# Patient Record
Sex: Female | Born: 1990 | Race: White | Hispanic: No | Marital: Single | Smoking: Never smoker
Health system: Southern US, Community
[De-identification: ages and names within clinical notes are randomized; demographics above are authoritative.]

## PROBLEM LIST (undated history)

## (undated) DIAGNOSIS — K219 Gastro-esophageal reflux disease without esophagitis: Secondary | ICD-10-CM

## (undated) DIAGNOSIS — J45909 Unspecified asthma, uncomplicated: Secondary | ICD-10-CM

## (undated) DIAGNOSIS — T7840XA Allergy, unspecified, initial encounter: Secondary | ICD-10-CM

## (undated) DIAGNOSIS — F419 Anxiety disorder, unspecified: Secondary | ICD-10-CM

## (undated) HISTORY — DX: Allergy, unspecified, initial encounter: T78.40XA

## (undated) HISTORY — DX: Unspecified asthma, uncomplicated: J45.909

## (undated) HISTORY — DX: Anxiety disorder, unspecified: F41.9

## (undated) HISTORY — DX: Gastro-esophageal reflux disease without esophagitis: K21.9

## (undated) HISTORY — PX: NO PAST SURGERIES: SHX2092

---

## 2011-05-25 ENCOUNTER — Emergency Department: Payer: Self-pay | Admitting: Unknown Physician Specialty

## 2011-10-26 ENCOUNTER — Emergency Department: Payer: Self-pay | Admitting: Emergency Medicine

## 2011-10-26 LAB — MONONUCLEOSIS SCREEN: Mono Test: NEGATIVE

## 2011-10-28 LAB — BETA STREP CULTURE(ARMC)

## 2012-07-27 IMAGING — CT CT HEAD WITHOUT CONTRAST
2 series · 16 of 30 positions shown, 20 images · non-contrast
Comparison: none

REASON FOR EXAM: head ache
COMMENTS:

PROCEDURE:     CT  - CT HEAD WITHOUT CONTRAST  - May 25, 2011  [DATE]
RESULT:     Technique: Helical 5mm sections were obtained from the skull
base to the vertex without administration of intravenous contrast.

[Series 2: without · axial · non-contrast · 0.44mm/px · z∈[-145,-25]mm · 13 of 30 slices shown, 17 images]
[im 3/30  brain]
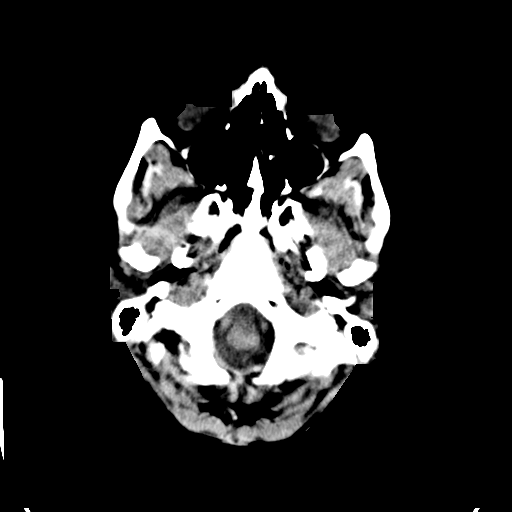
[im 3/30  bone]
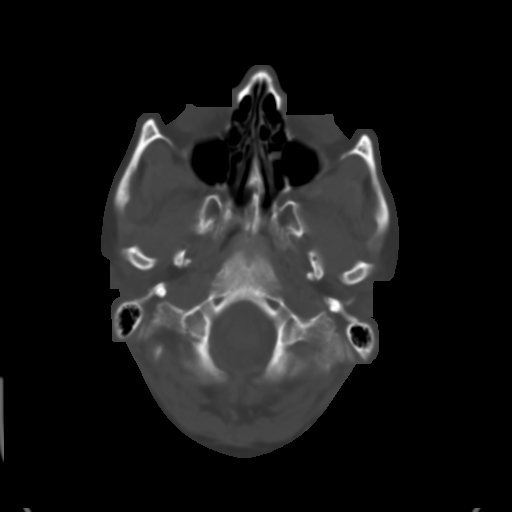
[im 5/30  brain]
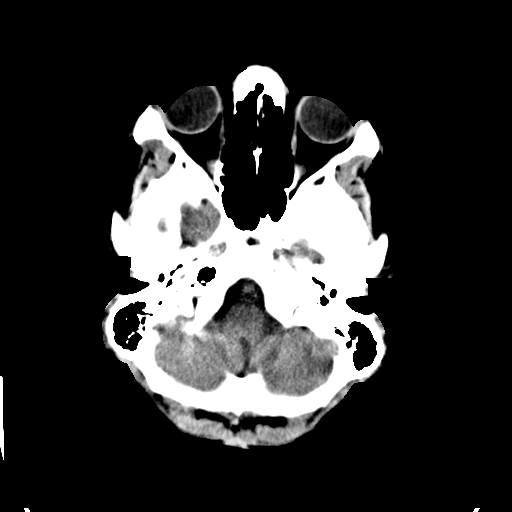
[im 7/30  brain]
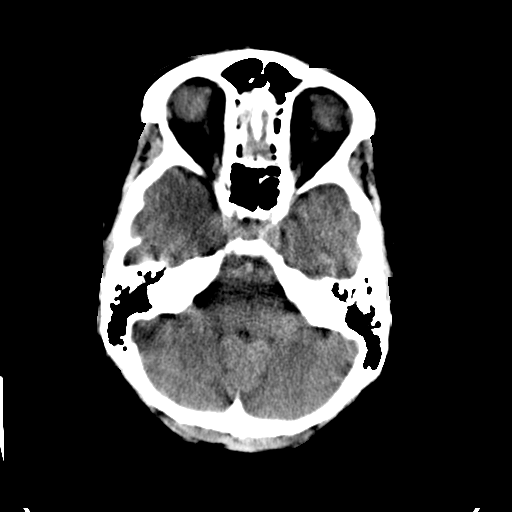
[im 9/30  brain]
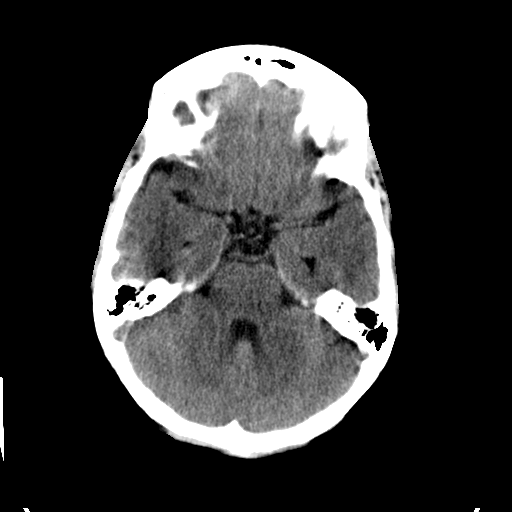
[im 11/30  brain]
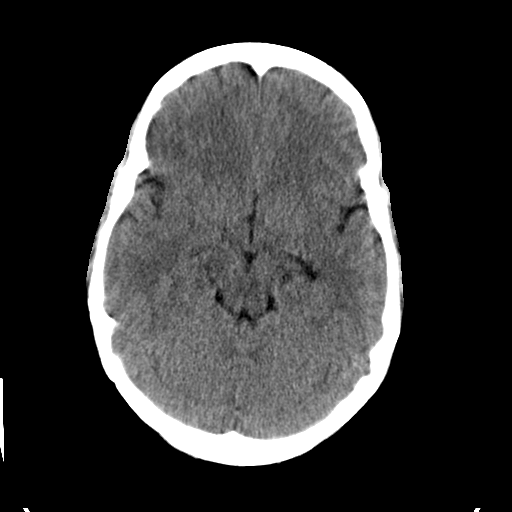
[im 11/30  bone]
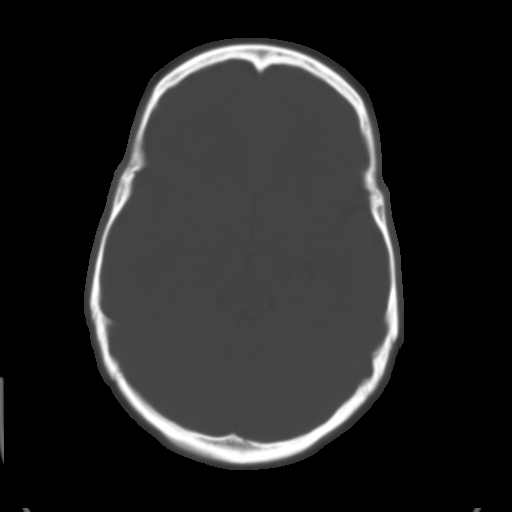
[im 13/30  brain]
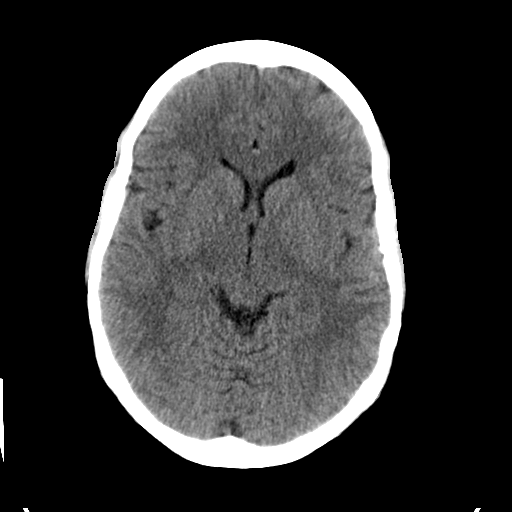
[im 15/30  brain]
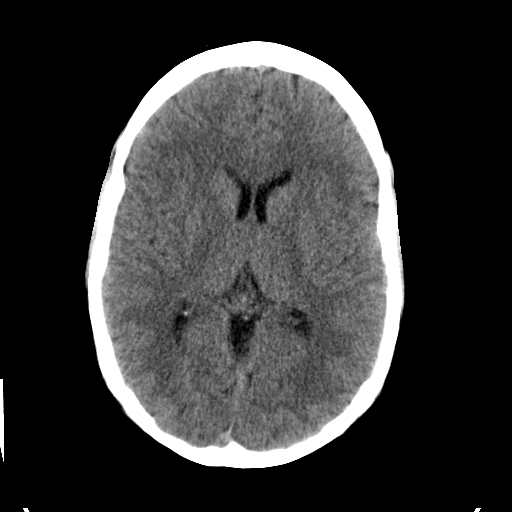
[im 17/30  brain]
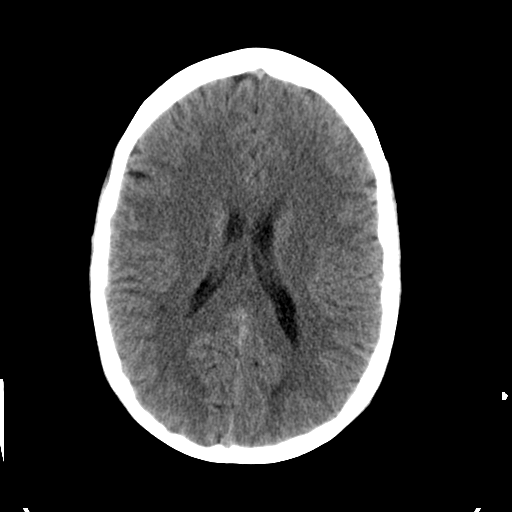
[im 19/30  brain]
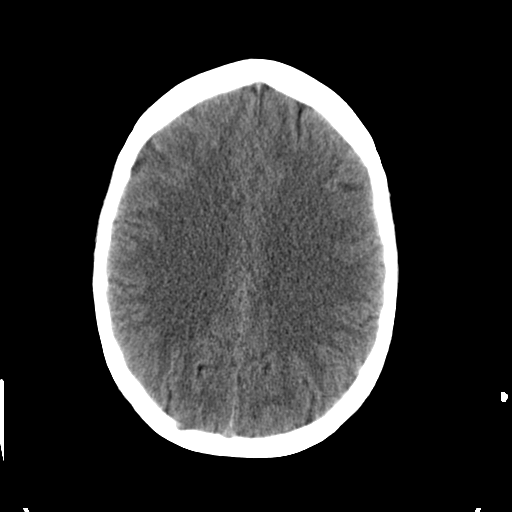
[im 19/30  bone]
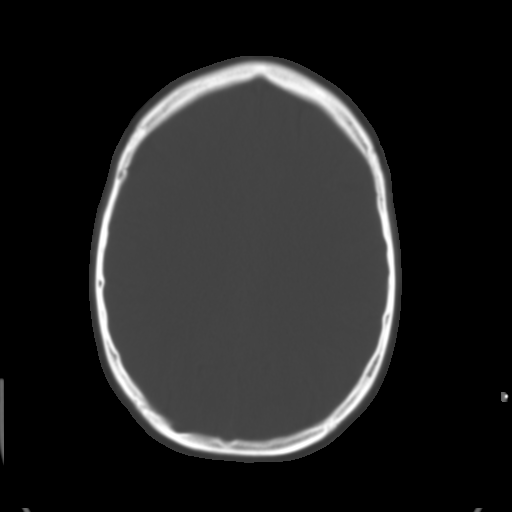
[im 21/30  brain]
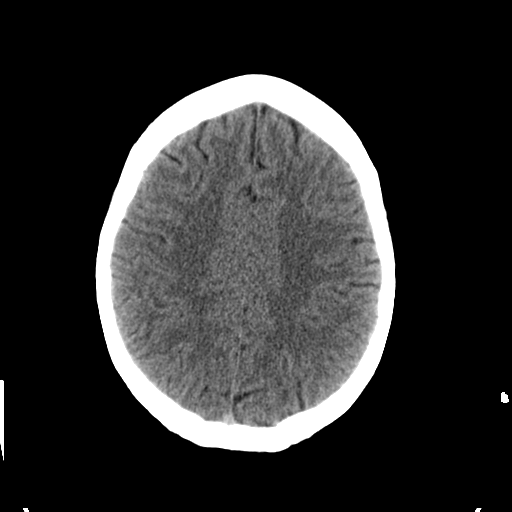
[im 23/30  brain]
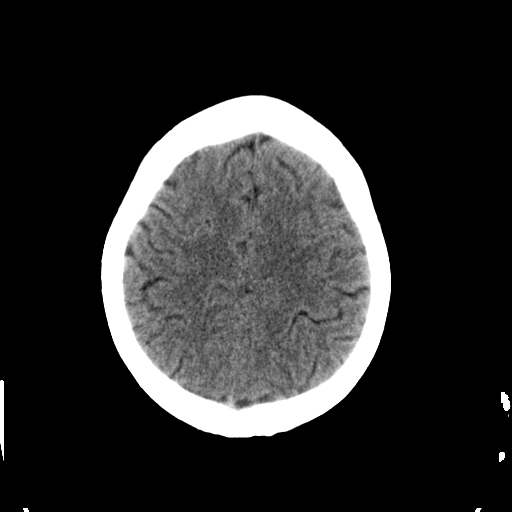
[im 25/30  brain]
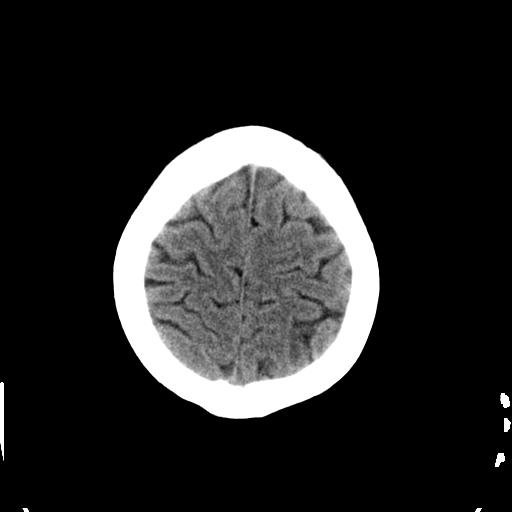
[im 27/30  brain]
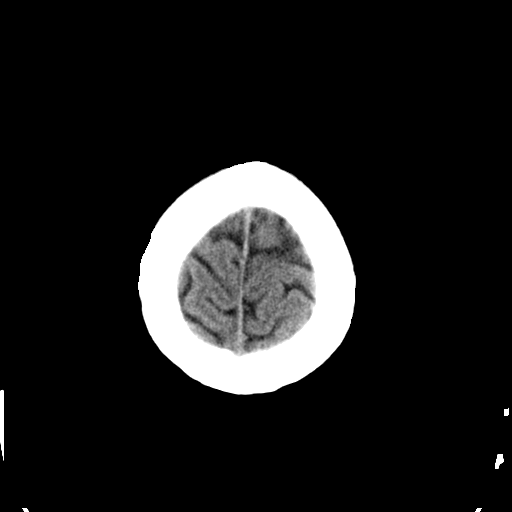
[im 27/30  bone]
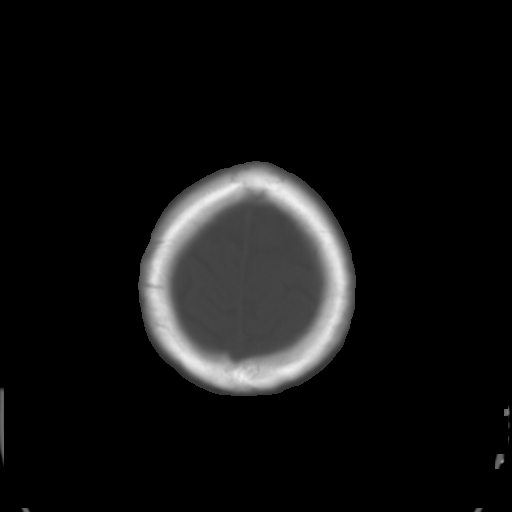

[Series 3: bone · axial · 0.44mm/px · z∈[-145,-105]mm · 3 of 30 slices shown]
[im 3/30  bone]
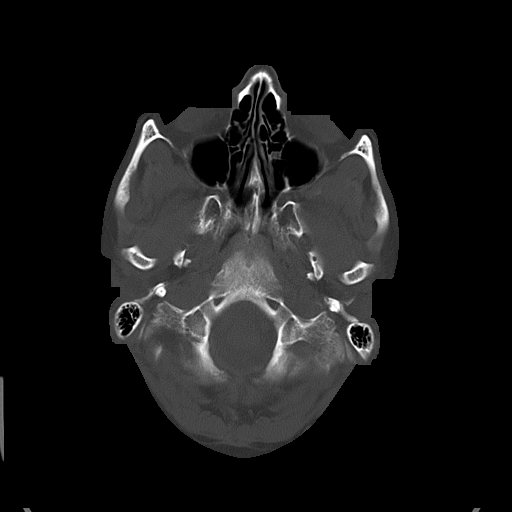
[im 7/30  bone]
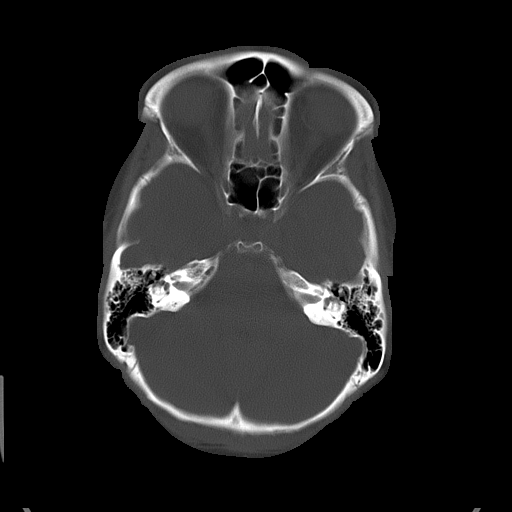
[im 11/30  bone]
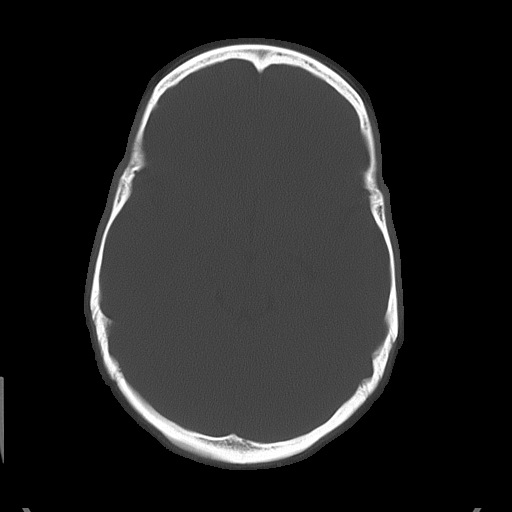

[16 of 30 positions shown; findings below may reference images not displayed]

FINDINGS: There is not evidence of intra-axial fluid collections. There is
no evidence of acute hemorrhage or secondary signs reflecting mass effect or
subacute or chronic focal territorial infarction. The osseous structures
demonstrate no evidence of a depressed skull fracture. If there is
persistent concern clinical follow-up with MRI is recommended.
IMPRESSION: 1. No evidence of acute intracranial abnormalitites.

## 2014-08-12 ENCOUNTER — Emergency Department: Payer: Self-pay | Admitting: Emergency Medicine

## 2016-04-01 ENCOUNTER — Encounter: Payer: Self-pay | Admitting: *Deleted

## 2016-04-03 ENCOUNTER — Encounter: Payer: Self-pay | Admitting: *Deleted

## 2016-04-09 ENCOUNTER — Ambulatory Visit (INDEPENDENT_AMBULATORY_CARE_PROVIDER_SITE_OTHER): Payer: 59 | Admitting: General Surgery

## 2016-04-09 ENCOUNTER — Encounter: Payer: Self-pay | Admitting: General Surgery

## 2016-04-09 ENCOUNTER — Ambulatory Visit: Payer: Self-pay | Admitting: General Surgery

## 2016-04-09 VITALS — BP 120/74 | HR 76 | Resp 12 | Ht 69.5 in | Wt 230.0 lb

## 2016-04-09 DIAGNOSIS — L729 Follicular cyst of the skin and subcutaneous tissue, unspecified: Secondary | ICD-10-CM | POA: Insufficient documentation

## 2016-04-09 DIAGNOSIS — L732 Hidradenitis suppurativa: Secondary | ICD-10-CM | POA: Insufficient documentation

## 2016-04-09 MED ORDER — DOXYCYCLINE HYCLATE 100 MG PO CAPS: 100.0000 mg | ORAL_CAPSULE | Freq: Two times a day (BID) | ORAL | 0 refills | Status: DC

## 2016-04-09 NOTE — Progress Notes (Signed)
Patient ID: Sheila Gilbert, female   DOB: 10-20-1990, 25 y.o.   MRN: [id number]  Chief Complaint  Patient presents with  . Other    left axillary lump    HPI Sheila Gilbert is a 25 y.o. female.  Here today for a left axillary knot. She first noticed this area mid May. States it is larger than a marble. Denies pain to the area. No drainage.  She states it may change colors with sport bras. She works at [employer] and [employer].  HPI  Past Medical History:  Diagnosis Date  . Anxiety     No past surgical history on file.  Family History  Problem Relation Age of Onset  . Breast cancer Maternal Grandmother 50    ovarian  . Breast cancer Maternal Aunt 30  . Ovarian cancer Maternal Aunt     Social History Social History  Substance Use Topics  . Smoking status: Never Smoker  . Smokeless tobacco: Never Used  . Alcohol use Yes    Allergies  Allergen Reactions  . Latex Swelling    Current Outpatient Prescriptions  Medication Sig Dispense Refill  . buPROPion (WELLBUTRIN SR) 150 MG 12 hr tablet Take 150 mg by mouth daily.    Marland Kitchen doxycycline (VIBRAMYCIN) 100 MG capsule Take 1 capsule (100 mg total) by mouth 2 (two) times daily. 28 capsule 0   No current facility-administered medications for this visit.     Review of Systems Review of Systems  Constitutional: Negative.   Respiratory: Negative.   Cardiovascular: Negative.     Blood pressure 120/74, pulse 76, resp. rate 12, height 5' 9.5" (1.765 m), weight 230 lb (104.3 kg), last menstrual period 02/18/2016.  Physical Exam Physical Exam  Constitutional: She is oriented to person, place, and time. She appears well-developed and well-nourished.  HENT:  Mouth/Throat: Oropharynx is clear and moist.  Neck: Neck supple.  Pulmonary/Chest:    Lymphadenopathy:    She has no cervical adenopathy.    She has no axillary adenopathy.  1 x 2 cm area of thickening left axillary skin.  Neurological: She is alert and oriented to person,  place, and time.  Skin: Skin is warm and dry.  Psychiatric: Her behavior is normal.    Data Reviewed PCP notes from Burlene Arnt, CNM dated 03/28/2016.  Assessment    Likely normally inflamed sebaceous cyst involving the left axillary skin.    Plan    A trial of doxycycline and local heat is been recommended.     The patient has been encouraged to find out if her aunts were tested for BRCA. With the family history of ovarian cancer in 2 generations as well as breast cancer in 2 generations on her maternal side this would be important information.  Recommend heating pad to the area twice a day. Doxycycline 100 mg #28. Call with status report in 2 weeks.    This information has been scribed by Karie Fetch RN, BSN,BC.   Robert Bellow 04/09/2016, 7:49 PM

## 2016-04-09 NOTE — Patient Instructions (Addendum)
Recommend heating pad to the area twice a day. Doxycycline 100 mg #28. Call with status report in 2 weeks. Find out about family BRCA testing if done?

## 2017-10-06 ENCOUNTER — Emergency Department
Admission: EM | Admit: 2017-10-06 | Discharge: 2017-10-06 | Disposition: A | Discharge status: Home / Self Care | Payer: Self-pay | Attending: Emergency Medicine | Admitting: Emergency Medicine

## 2017-10-06 ENCOUNTER — Emergency Department: Payer: Self-pay

## 2017-10-06 ENCOUNTER — Other Ambulatory Visit: Payer: Self-pay

## 2017-10-06 ENCOUNTER — Encounter: Payer: Self-pay | Admitting: Emergency Medicine

## 2017-10-06 DIAGNOSIS — R05 Cough: Secondary | ICD-10-CM | POA: Insufficient documentation

## 2017-10-06 DIAGNOSIS — N926 Irregular menstruation, unspecified: Secondary | ICD-10-CM | POA: Insufficient documentation

## 2017-10-06 DIAGNOSIS — R059 Cough, unspecified: Secondary | ICD-10-CM

## 2017-10-06 DIAGNOSIS — Z79899 Other long term (current) drug therapy: Secondary | ICD-10-CM | POA: Insufficient documentation

## 2017-10-06 LAB — CBC WITH DIFFERENTIAL/PLATELET
Basophils Absolute: 0.1 10*3/uL (ref 0–0.1)
Basophils Relative: 1 %
EOS PCT: 1 %
Eosinophils Absolute: 0.1 10*3/uL (ref 0–0.7)
HCT: 41.5 % (ref 35.0–47.0)
Hemoglobin: 13.8 g/dL (ref 12.0–16.0)
LYMPHS PCT: 31 %
Lymphs Abs: 2.4 10*3/uL (ref 1.0–3.6)
MCH: 29 pg (ref 26.0–34.0)
MCHC: 33.3 g/dL (ref 32.0–36.0)
MCV: 87.2 fL (ref 80.0–100.0)
MONO ABS: 0.6 10*3/uL (ref 0.2–0.9)
MONOS PCT: 8 %
Neutro Abs: 4.6 10*3/uL (ref 1.4–6.5)
Neutrophils Relative %: 59 %
PLATELETS: 285 10*3/uL (ref 150–440)
RBC: 4.77 MIL/uL (ref 3.80–5.20)
RDW: 13.9 % (ref 11.5–14.5)
WBC: 7.8 10*3/uL (ref 3.6–11.0)

## 2017-10-06 LAB — URINALYSIS, COMPLETE (UACMP) WITH MICROSCOPIC
BACTERIA UA: NONE SEEN
Bilirubin Urine: NEGATIVE
Glucose, UA: NEGATIVE mg/dL
HGB URINE DIPSTICK: NEGATIVE
Ketones, ur: 5 mg/dL — AB
Leukocytes, UA: NEGATIVE
NITRITE: NEGATIVE
PROTEIN: NEGATIVE mg/dL
RBC / HPF: NONE SEEN RBC/hpf (ref 0–5)
SPECIFIC GRAVITY, URINE: 1.025 (ref 1.005–1.030)
SQUAMOUS EPITHELIAL / LPF: NONE SEEN
WBC UA: NONE SEEN WBC/hpf (ref 0–5)
pH: 5 (ref 5.0–8.0)

## 2017-10-06 LAB — COMPREHENSIVE METABOLIC PANEL
ALBUMIN: 4.5 g/dL (ref 3.5–5.0)
ALT: 40 U/L (ref 14–54)
AST: 34 U/L (ref 15–41)
Alkaline Phosphatase: 62 U/L (ref 38–126)
Anion gap: 9 (ref 5–15)
BUN: 9 mg/dL (ref 6–20)
CO2: 23 mmol/L (ref 22–32)
CREATININE: 0.64 mg/dL (ref 0.44–1.00)
Calcium: 8.9 mg/dL (ref 8.9–10.3)
Chloride: 107 mmol/L (ref 101–111)
GFR calc Af Amer: >60 mL/min (ref >60)
GLUCOSE: 103 mg/dL — AB (ref 65–99)
Potassium: 3.5 mmol/L (ref 3.5–5.1)
Sodium: 139 mmol/L (ref 135–145)
Total Bilirubin: 0.7 mg/dL (ref 0.3–1.2)
Total Protein: 7.8 g/dL (ref 6.5–8.1)

## 2017-10-06 LAB — POC URINE PREG, ED: Preg Test, Ur: NEGATIVE

## 2017-10-06 LAB — HCG, QUANTITATIVE, PREGNANCY: hCG, Beta Chain, Quant, S: <1 m[IU]/mL (ref <5)

## 2017-10-06 MED ORDER — BENZONATATE 100 MG PO CAPS: 100.0000 mg | ORAL_CAPSULE | Freq: Three times a day (TID) | ORAL | 0 refills | Status: DC | PRN

## 2017-10-06 MED ORDER — FAMOTIDINE 20 MG PO TABS: 20.0000 mg | ORAL_TABLET | Freq: Two times a day (BID) | ORAL | 1 refills | Status: DC

## 2017-10-06 MED ORDER — ACETAMINOPHEN 500 MG PO TABS
1000.0000 mg | ORAL_TABLET | Freq: Once | ORAL | Status: AC
Start: 2017-10-06 — End: 2017-10-06
  Administered 2017-10-06: 1000 mg via ORAL
  Filled 2017-10-06: qty 2

## 2017-10-06 NOTE — ED Notes (Signed)
ED Provider at bedside. 

## 2017-10-06 NOTE — ED Notes (Signed)
Radiology notified POC urine negative

## 2017-10-06 NOTE — ED Notes (Signed)

## 2017-10-06 NOTE — ED Provider Notes (Signed)
[facility] Emergency Department Provider Note  ____________________________________________  Time seen: Approximately 9:48 PM  I have reviewed the triage vital signs and the nursing notes.   HISTORY  Chief Complaint Cough and Shortness of Breath   HPI Sheila Gilbert is a 27 y.o. female who presents for evaluation of several medical complaints. Patient reports that she has had a dry cough for a month. A few weeks ago she had feverbut none recently. The cough is dry and sometimes it brings a sour taste to her mouth. She has been felling tired and has mild SOB. No CP. No history of smoking, asthma. She is also complaining of bilateral low back pain which she describes as sharp, moderate, constant for the last few days. She denies dysuria or hematuria. She reports having similar pain when she had pyelonephritis and at that time she also didn't have dysuria or hematuria. She denies abdominal pain, nausea, vomiting, diarrhea, constipation. She is also complaining of bright red vaginal bleeding that started today. She has been changing her pads every hour. She reports that she has been on Nexplanon for 3 years. No vaginal discharge, no syncope.   Past Medical History:  Diagnosis Date  . Anxiety     Patient Active Problem List   Diagnosis Date Noted  . Skin cyst 04/09/2016    History reviewed. No pertinent surgical history.  Prior to Admission medications   Medication Sig Start Date End Date Taking? Authorizing Provider  benzonatate (TESSALON PERLES) 100 MG capsule Take 1 capsule (100 mg total) by mouth 3 (three) times daily as needed for cough. 10/06/17 10/06/18  Nita Sickle, MD  buPROPion [facility] SR) 150 MG 12 hr tablet Take 150 mg by mouth daily.    [provider]  doxycycline (VIBRAMYCIN) 100 MG capsule Take 1 capsule (100 mg total) by mouth 2 (two) times daily. 04/09/16   Earline Mayotte, MD  famotidine (PEPCID) 20 MG tablet Take 1 tablet  (20 mg total) by mouth 2 (two) times daily. 10/06/17 10/06/18  Nita Sickle, MD    Allergies Latex  Family History  Problem Relation Age of Onset  . Breast cancer Maternal Grandmother 50       ovarian  . Breast cancer Maternal Aunt 30  . Ovarian cancer Maternal Aunt     Social History Social History   Tobacco Use  . Smoking status: Never Smoker  . Smokeless tobacco: Never Used  Substance Use Topics  . Alcohol use: Yes  . Drug use: No    Review of Systems  Constitutional: Negative for fever. Eyes: Negative for visual changes. ENT: Negative for sore throat. Neck: No neck pain  Cardiovascular: Negative for chest pain. Respiratory: + shortness of breath and cough Gastrointestinal: Negative for abdominal pain, vomiting or diarrhea. Genitourinary: Negative for dysuria. + vaginal bleeding Musculoskeletal: + bilateral lower back pain. Skin: Negative for rash. Neurological: Negative for headaches, weakness or numbness. Psych: No SI or HI  ____________________________________________   PHYSICAL EXAM:  VITAL SIGNS: ED Triage Vitals  Enc Vitals Group     BP 10/06/17 2043 132/73     Pulse Rate 10/06/17 2043 77     Resp 10/06/17 2043 20     Temp 10/06/17 2043 98.3 F (36.8 C)     Temp Source 10/06/17 2043 Oral     SpO2 10/06/17 2043 99 %     Weight 10/06/17 1947 230 lb (104.3 kg)     Height 10/06/17 1947 5\' 10"  (1.778 m)  Head Circumference --      Peak Flow --      Pain Score 10/06/17 1947 8     Pain Loc --      Pain Edu? --      Excl. in GC? --     Constitutional: Alert and oriented. Well appearing and in no apparent distress. HEENT:      Head: Normocephalic and atraumatic.         Eyes: Conjunctivae are normal. Sclera is non-icteric.       Mouth/Throat: Mucous membranes are moist.       Neck: Supple with no signs of meningismus. Cardiovascular: Regular rate and rhythm. No murmurs, gallops, or rubs. 2+ symmetrical distal pulses are present in all  extremities. No JVD. Respiratory: Normal respiratory effort. Lungs are clear to auscultation bilaterally. No wheezes, crackles, or rhonchi.  Gastrointestinal: Soft, non tender, and non distended with positive bowel sounds. No rebound or guarding. Genitourinary: No CVA tenderness. Musculoskeletal: Nontender with normal range of motion in all extremities. No edema, cyanosis, or erythema of extremities. Neurologic: Normal speech and language. Face is symmetric. Moving all extremities. No gross focal neurologic deficits are appreciated. Skin: Skin is warm, dry and intact. No rash noted. Psychiatric: Mood and affect are normal. Speech and behavior are normal.  ____________________________________________   LABS (all labs ordered are listed, but only abnormal results are displayed)  Labs Reviewed  COMPREHENSIVE METABOLIC PANEL - Abnormal; Notable for the following components:      Result Value   Glucose, Bld 103 (*)    All other components within normal limits  URINALYSIS, COMPLETE (UACMP) WITH MICROSCOPIC - Abnormal; Notable for the following components:   Color, Urine YELLOW (*)    APPearance TURBID (*)    Ketones, ur 5 (*)    All other components within normal limits  CBC WITH DIFFERENTIAL/PLATELET  HCG, QUANTITATIVE, PREGNANCY  POC URINE PREG, ED   ____________________________________________  EKG  ED ECG REPORT I, Nita Sickle, the attending physician, personally viewed and interpreted this ECG.  Normal sinus rhythm, rate of 82, normal intervals, normal axis, no ST elevations or depressions. Normal EKG   ____________________________________________  RADIOLOGY  Interpreted by me: CXR: negative   Interpretation by Radiologist:  Dg Chest 2 View  Result Date: 10/06/2017 CLINICAL DATA:  Cough and shortness of breath for 1 month. EXAM: CHEST  2 VIEW COMPARISON:  08/12/2014 FINDINGS: The heart size and mediastinal contours are within normal limits. Both lungs are clear. No  pleural effusion or pneumothorax. The visualized skeletal structures are unremarkable. IMPRESSION: Normal chest radiographs. Electronically Signed   By: Amie Portland M.D.   On: 10/06/2017 20:38     ____________________________________________   PROCEDURES  Procedure(s) performed: None Procedures Critical Care performed:  None ____________________________________________   INITIAL IMPRESSION / ASSESSMENT AND PLAN / ED COURSE  27 y.o. female who presents for evaluation of several medical complaints.  # cough for 1 month: possible viral URI or GERD with sour taste in mouth. CXR negative for PNA, labs WNL with normal WBC. EKG with no evidence of ischemia. Will send patient home on pepcid and tessalon perls  # vaginal bleeding: pregnancy test negative, patient had Nexplanon for 3 years. Bleeding most likely menstrual period in the end life of Nexplanon. Hgb and vitals stable. Will do hCG.  # lower back pain: patient worried about having UTI. UA pending. No flank ttp, no midline cspine ttp. Will give tylenol.    _________________________ 10:43 PM on 10/06/2017 -----------------------------------------  hCG negative. UA with no evidence of UTI. Patient's condition at the discharged home on Pepcid for possible reflux, Tessalon Perles, and follow-up with primary care doctor. Discussed return precautions.  As part of my medical decision making, I reviewed the following data within the electronic MEDICAL RECORD NUMBER Nursing notes reviewed and incorporated, Labs reviewed , Radiograph reviewed , Notes from prior ED visits and [place] Controlled Substance Database    Pertinent labs & imaging results that were available during my care of the patient were reviewed by me and considered in my medical decision making (see chart for details).    ____________________________________________   FINAL CLINICAL IMPRESSION(S) / ED DIAGNOSES  Final diagnoses:  Cough  Menstrual periods irregular       NEW MEDICATIONS STARTED DURING THIS VISIT:  ED Discharge Orders        Ordered    benzonatate (TESSALON PERLES) 100 MG capsule  3 times daily PRN     10/06/17 2230    famotidine (PEPCID) 20 MG tablet  2 times daily     10/06/17 2230       Note:  This document was prepared using Dragon voice recognition software and may include unintentional dictation errors.    Don PerkingVeronese, [place], MD 10/06/17 [phone]

## 2017-10-06 NOTE — ED Triage Notes (Signed)
Patient reports being "sick" for approx one month. Denies any known fevers. 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 Patient denies any productive sputum, but reports bad taste in mouth after coughing. Also reports bright red vaginal bleeding. States she has had to change pad every hour or so. Also states that this is not normal timing for her period. LMP beginning of December. Reports history of irregular periods, is on Nexplanon.

## 2017-10-06 NOTE — ED Notes (Signed)
Pt states cough, congestion x1 month causing chest discomfort and abnormal vaginal bleeding unrelated to period.

## 2017-10-07 LAB — POCT PREGNANCY, URINE: Preg Test, Ur: NEGATIVE

## 2017-10-09 ENCOUNTER — Ambulatory Visit: Payer: BLUE CROSS/BLUE SHIELD | Admitting: Obstetrics and Gynecology

## 2017-10-12 ENCOUNTER — Encounter: Payer: Self-pay | Admitting: Obstetrics and Gynecology

## 2017-10-12 ENCOUNTER — Ambulatory Visit (INDEPENDENT_AMBULATORY_CARE_PROVIDER_SITE_OTHER): Payer: BLUE CROSS/BLUE SHIELD | Admitting: Obstetrics and Gynecology

## 2017-10-12 VITALS — BP 110/72 | HR 99 | Ht 70.0 in | Wt 238.0 lb

## 2017-10-12 DIAGNOSIS — Z3009 Encounter for other general counseling and advice on contraception: Secondary | ICD-10-CM

## 2017-10-12 DIAGNOSIS — N939 Abnormal uterine and vaginal bleeding, unspecified: Secondary | ICD-10-CM | POA: Diagnosis not present

## 2017-10-12 NOTE — Progress Notes (Signed)
Obstetrics & Gynecology Office Visit   Chief Complaint:  Chief Complaint  Patient presents with  . ER follow up    SAB    History of Present Illness: Patient is a 27 y.o. female G1P1 presenting for emergency room follow up.  At the time the patient has nausea, emesis, vaginal bleeding.  She did not have any imaging done.  Was told she is likely experiencing symptoms of an early miscarriage even though her urine pregnancy and HCG were negative.  She also has a nexplanon in place.  This was placed by Dr. Luella Cook on 04/19/2014.  She has had issues with AUB-I secondary to the nexplanon in the past.  No abdominal pain.  No fevers, chills.  Was previously on Mirena and did well with this.  She is interested in conceiving in the next 2-3 years.     Review of Systems: Review of Systems  Constitutional: Negative for chills and fever.  Gastrointestinal: Negative for abdominal pain, constipation, diarrhea, heartburn, nausea and vomiting.  Genitourinary: Negative for dysuria, frequency and urgency.  Skin: Negative for itching and rash.  Neurological: Negative for dizziness and headaches.  Endo/Heme/Allergies: Negative for polydipsia.     Past Medical History:  Past Medical History:  Diagnosis Date  . Anxiety     Past Surgical History:  No past surgical history on file.  Gynecologic History: No LMP recorded. Patient has had an implant.  Obstetric History: G1P1  Family History:  Family History  Problem Relation Age of Onset  . Breast cancer Maternal Grandmother 50       ovarian  . Breast cancer Maternal Aunt 30  . Ovarian cancer Maternal Aunt     Social History:  Social History   Socioeconomic History  . Marital status: Single    Spouse name: Not on file  . Number of children: Not on file  . Years of education: Not on file  . Highest education level: Not on file  Social Needs  . Financial resource strain: Not on file  . Food insecurity - worry: Not on file  . Food  insecurity - inability: Not on file  . Transportation needs - medical: Not on file  . Transportation needs - non-medical: Not on file  Occupational History  . Not on file  Tobacco Use  . Smoking status: Never Smoker  . Smokeless tobacco: Never Used  Substance and Sexual Activity  . Alcohol use: Yes  . Drug use: No  . Sexual activity: Yes    Birth control/protection: None  Other Topics Concern  . Not on file  Social History Narrative  . Not on file    Allergies:  Allergies  Allergen Reactions  . Latex Swelling    Medications: Prior to Admission medications   Medication Sig Start Date End Date Taking? Authorizing Provider  amoxicillin-clavulanate (AUGMENTIN) 875-125 MG tablet Take 1 tablet by mouth 2 (two) times daily with a meal. 10/07/17  Yes [provider]  benzonatate (TESSALON PERLES) 100 MG capsule Take 1 capsule (100 mg total) by mouth 3 (three) times daily as needed for cough. 10/06/17 10/06/18 Yes Nita Sickle, MD    Physical Exam Vitals:  Vitals:   10/12/17 1144  BP: 110/72  Pulse: 99   No LMP recorded. Patient has had an implant.  General: NAD HEENT: normocephalic, anicteric Pulmonary: No increased work of breathing Extremities: no edema, erythema, or tenderness Neurologic: Grossly intact Psychiatric: mood appropriate, affect full  Female chaperone present for pelvic  portions of the physical exam  Assessment: 27 y.o. G1P1 AUB-I secondary to nexplanon  Plan: Problem List Items Addressed This Visit    None    Visit Diagnoses    Abnormal uterine bleeding    -  Primary   Encounter for counseling regarding contraception         Reviewed all forms of birth control options available including abstinence; over the counter/barrier methods; hormonal contraceptive medication including pill, patch, ring, injection,contraceptive implant; hormonal and nonhormonal IUDs; permanent sterilization options including vasectomy and the various tubal  sterilization modalities. Risks and benefits reviewed.  Questions were answered.  Information was given to patient to review.   She understands that Nexplanon is a progesterone only therapy, and that patients often patients have irregular and unpredictable vaginal bleeding or amenorrhea. She understands that other side effects are possible related to systemic progesterone, including but not limited to, headaches, breast tenderness, nausea, and irritability (does feel like she has some mood changes over past 3 years). While effective at preventing pregnancy long acting reversible contraceptives do not prevent transmission of sexually transmitted diseases and use of barrier methods for this purpose was discussed.    - Discussed mirena, kylena, paraguard, nuvaring on nexplanon removal.  The device has reached its FDA approved 3 year lifespan.  Good data that effectiveness is up to 4 years after placement.   - most intersted in nuvaring - Nexplanon removal in 1 week  A total of 15 minutes were spent in face-to-face contact with the patient during this encounter with over half of that time devoted to counseling and coordination of care.

## 2017-10-21 ENCOUNTER — Ambulatory Visit (INDEPENDENT_AMBULATORY_CARE_PROVIDER_SITE_OTHER): Payer: BLUE CROSS/BLUE SHIELD | Admitting: Obstetrics and Gynecology

## 2017-10-21 ENCOUNTER — Ambulatory Visit: Payer: BLUE CROSS/BLUE SHIELD | Admitting: Obstetrics and Gynecology

## 2017-10-21 ENCOUNTER — Encounter: Payer: Self-pay | Admitting: Obstetrics and Gynecology

## 2017-10-21 VITALS — BP 118/72 | HR 86 | Ht 70.0 in | Wt 238.0 lb

## 2017-10-21 DIAGNOSIS — Z3046 Encounter for surveillance of implantable subdermal contraceptive: Secondary | ICD-10-CM | POA: Diagnosis not present

## 2017-10-21 MED ORDER — ETONOGESTREL-ETHINYL ESTRADIOL 0.12-0.015 MG/24HR VA RING: VAGINAL_RING | VAGINAL | 11 refills | Status: DC

## 2017-10-21 NOTE — Progress Notes (Signed)
   GYNECOLOGY PROCEDURE NOTE  Implanon removal discussed in detail.  Risks of infection, bleeding, nerve injury all reviewed.  Patient understands risks and desires to proceed.  Verbal consent obtained.  Patient is certain she wants the implanon removed.  All questions answered.  Procedure: Patient placed in dorsal supine with right arm above head, elbow flexed at 90 degrees, arm resting on examination table.  Implanon identified without problems.  Betadine scrub x3.  1 ml of 1% lidocaine injected under implanon device without problems.  Sterile gloves applied.  Small 0.5cm incision made at distal tip of implanon device with 11 blade scalpel.  Implanon brought to incision and grasped with a small kelly clamp.  Implanon removed intact without problems.  Pressure applied to incision.  Hemostasis obtained.  Steri-strips applied, followed by bandage and compression dressing.  Patient tolerated procedure well.  No complications.   Assessment: 27 y.o. year old female now s/p uncomplicated implanon removal.  Plan: 1.  Patient given post procedure precautions and asked to call for fever, chills, redness or drainage from her incision, bleeding from incision.  She understands she will likely have a small bruise near site of removal and can remove bandage tomorrow and steri-strips in approximately 1 week.  2) Contraception interested in nuvaring, sample provided

## 2017-10-21 NOTE — Patient Instructions (Signed)
Nexplanon Instructions After Removal  Keep bandage clean and dry for 24 hours  May use ice/Tylenol/Ibuprofen for soreness or pain  If you develop fever, drainage or increased warmth from incision site-contact office immediately   

## 2018-12-09 IMAGING — CR DG CHEST 2V
2 series · 2 of 2 positions shown · non-contrast
Comparison: 08/12/2014

CLINICAL DATA: Cough and shortness of breath for 1 month.

EXAM:
CHEST  2 VIEW

[chest pa]
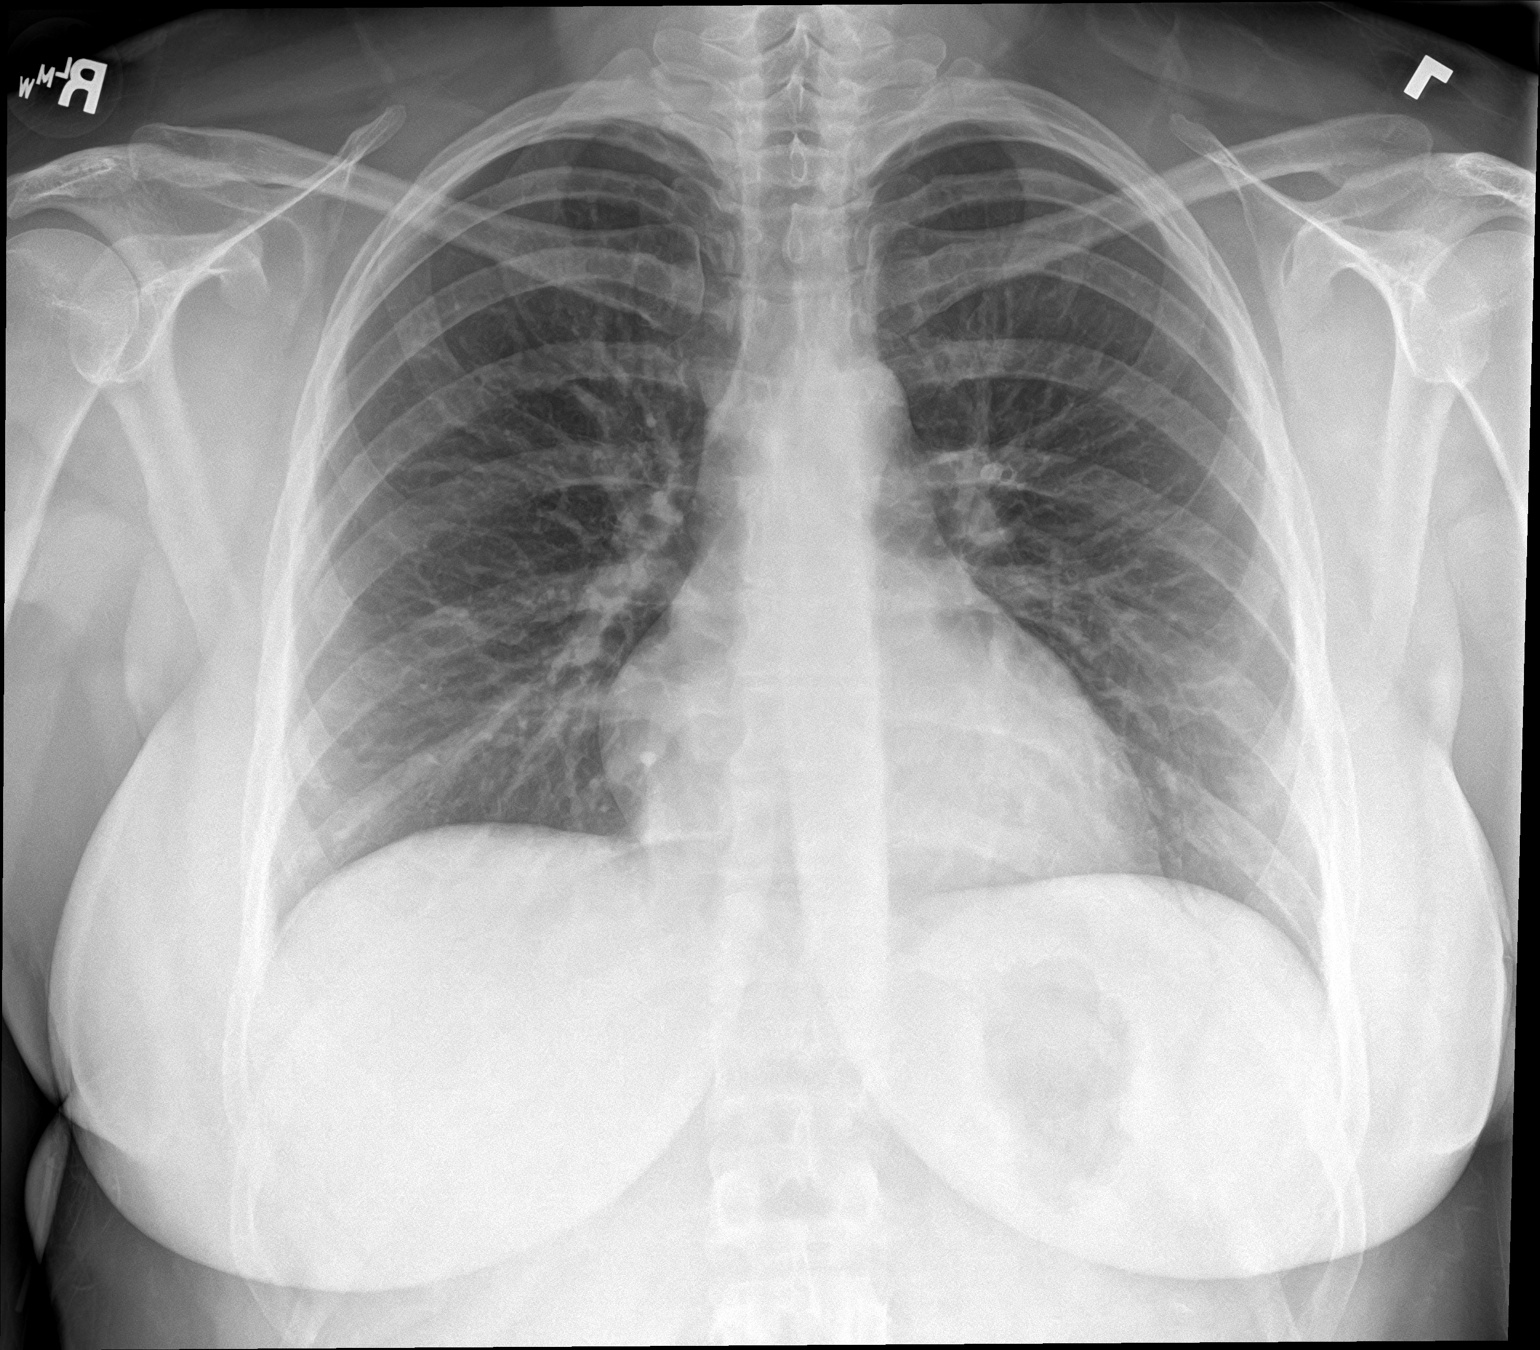

[chest lat]
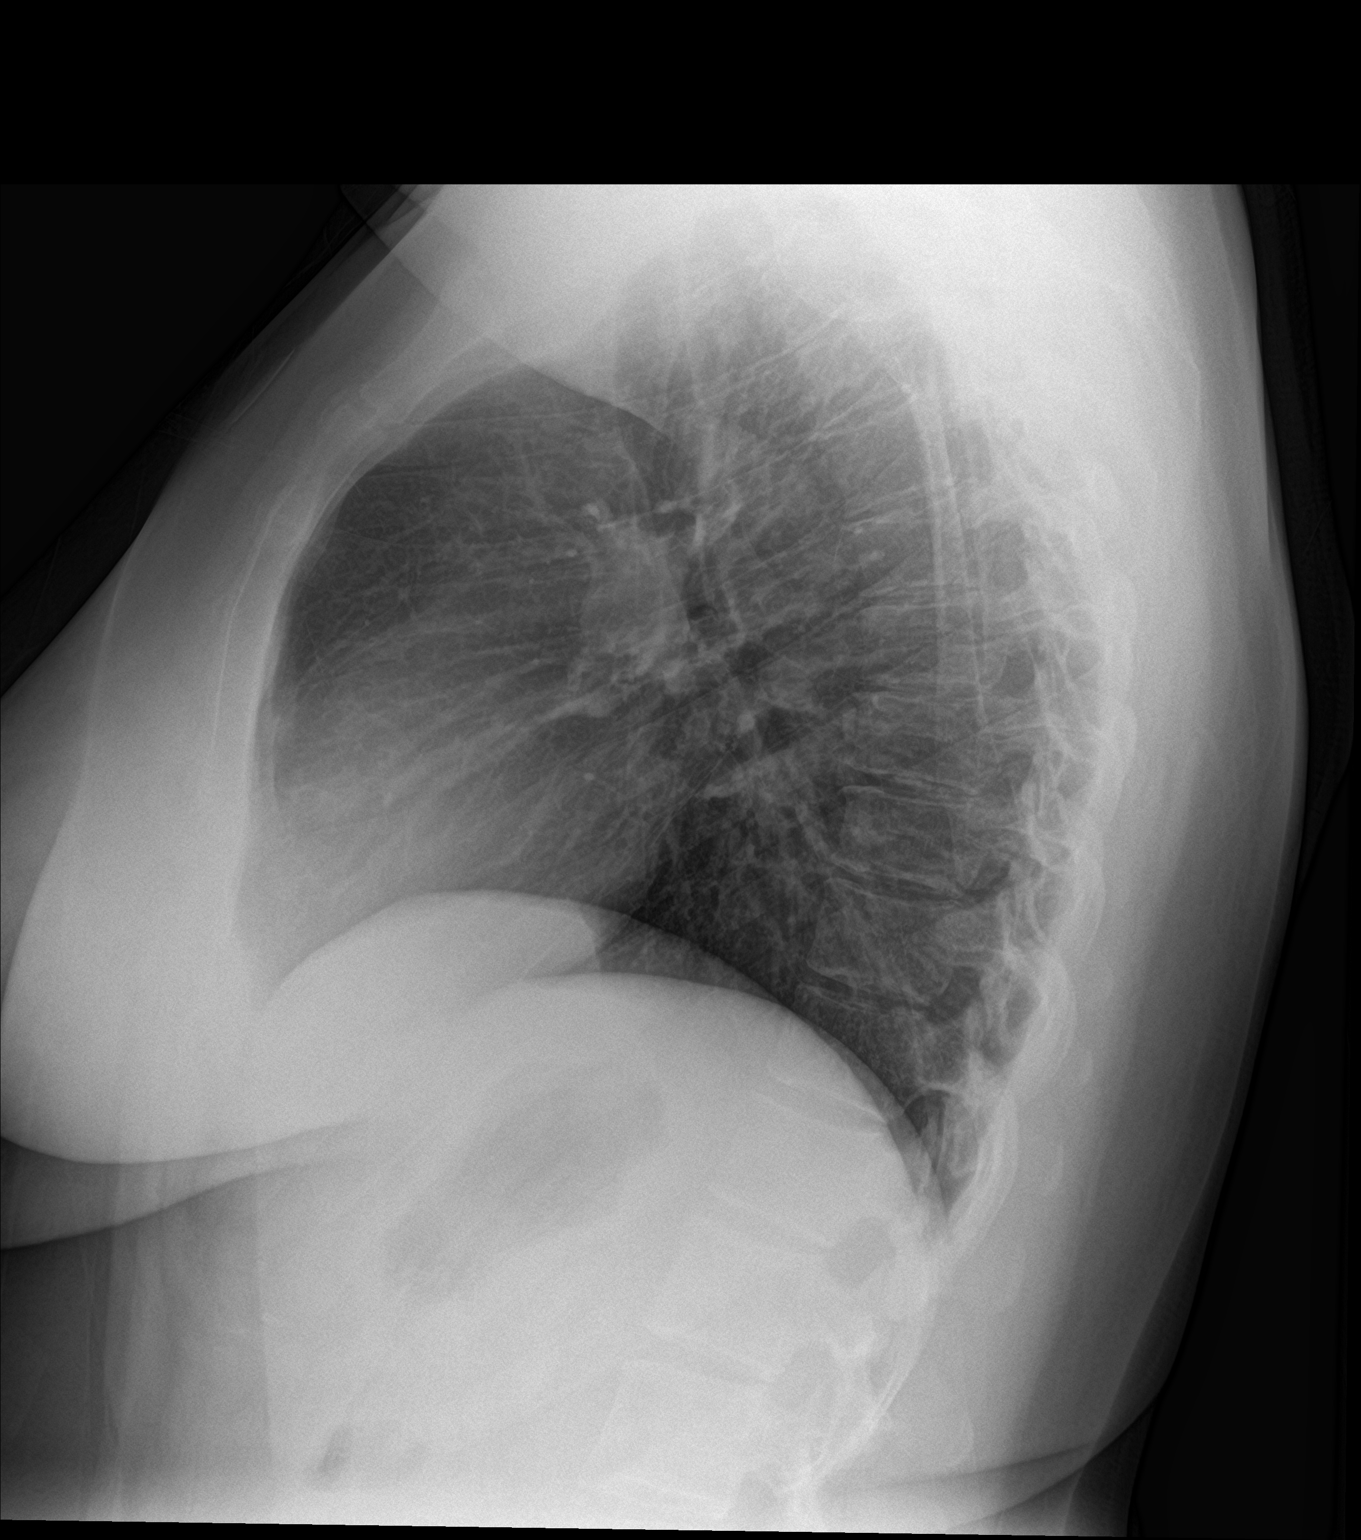

[2 of 2 positions shown; findings below may reference images not displayed]

FINDINGS: The heart size and mediastinal contours are within normal limits.
Both lungs are clear. No pleural effusion or pneumothorax. The
visualized skeletal structures are unremarkable.
IMPRESSION: Normal chest radiographs.

## 2019-06-17 ENCOUNTER — Other Ambulatory Visit: Payer: Self-pay

## 2019-06-17 DIAGNOSIS — Z20822 Contact with and (suspected) exposure to covid-19: Secondary | ICD-10-CM

## 2019-06-18 LAB — NOVEL CORONAVIRUS, NAA: SARS-CoV-2, NAA: NOT DETECTED

## 2019-08-29 ENCOUNTER — Telehealth: Payer: Self-pay | Admitting: Obstetrics and Gynecology

## 2019-08-29 ENCOUNTER — Other Ambulatory Visit: Payer: Self-pay

## 2019-08-29 MED ORDER — ETONOGESTREL-ETHINYL ESTRADIOL 0.12-0.015 MG/24HR VA RING: VAGINAL_RING | VAGINAL | 1 refills | Status: DC

## 2019-08-29 NOTE — Telephone Encounter (Signed)
Patient is schedule 10/04/19 with AMS for annual. Requesting refilll on Nuva ring. Please advise

## 2019-10-04 ENCOUNTER — Encounter: Payer: Self-pay | Admitting: Obstetrics and Gynecology

## 2019-10-04 ENCOUNTER — Ambulatory Visit (INDEPENDENT_AMBULATORY_CARE_PROVIDER_SITE_OTHER): Payer: Managed Care, Other (non HMO) | Admitting: Obstetrics and Gynecology

## 2019-10-04 ENCOUNTER — Other Ambulatory Visit: Payer: Self-pay

## 2019-10-04 VITALS — BP 106/64 | HR 93 | Wt 221.0 lb

## 2019-10-04 DIAGNOSIS — Z1239 Encounter for other screening for malignant neoplasm of breast: Secondary | ICD-10-CM

## 2019-10-04 DIAGNOSIS — Z113 Encounter for screening for infections with a predominantly sexual mode of transmission: Secondary | ICD-10-CM

## 2019-10-04 DIAGNOSIS — Z3044 Encounter for surveillance of vaginal ring hormonal contraceptive device: Secondary | ICD-10-CM

## 2019-10-04 DIAGNOSIS — Z124 Encounter for screening for malignant neoplasm of cervix: Secondary | ICD-10-CM

## 2019-10-04 DIAGNOSIS — Z01419 Encounter for gynecological examination (general) (routine) without abnormal findings: Secondary | ICD-10-CM | POA: Diagnosis not present

## 2019-10-04 MED ORDER — ETONOGESTREL-ETHINYL ESTRADIOL 0.12-0.015 MG/24HR VA RING: VAGINAL_RING | VAGINAL | 3 refills | Status: DC

## 2019-10-04 NOTE — Progress Notes (Signed)
Gynecology Annual Exam   PCP: Patient, No Pcp Per  Chief Complaint:  Chief Complaint  Patient presents with  . Gynecologic Exam    Refill on Nuvaring    History of Present Illness: Patient is a 29 y.o. G1P1 presents for annual exam. The patient has no complaints today.   LMP: Patient's last menstrual period was 09/29/2019 (approximate). Average Interval: regular, 28 days Duration of flow: 5 days Heavy Menses: no Clots: no Intermenstrual Bleeding: no Postcoital Bleeding: no Dysmenorrhea: no  The patient is sexually active. She currently uses NuvaRing vaginal inserts for contraception. She denies dyspareunia.  The patient does perform self breast exams.  There is no notable family history of breast or ovarian cancer in her family.  The patient wears seatbelts: yes.   The patient has regular exercise: not asked.    The patient denies current symptoms of depression.    Review of Systems: Review of Systems  Constitutional: Negative for chills and fever.  HENT: Negative for congestion.   Respiratory: Negative for cough and shortness of breath.   Cardiovascular: Negative for chest pain and palpitations.  Gastrointestinal: Negative for abdominal pain, constipation, diarrhea, heartburn, nausea and vomiting.  Genitourinary: Negative for dysuria, frequency and urgency.  Skin: Negative for itching and rash.  Neurological: Negative for dizziness and headaches.  Endo/Heme/Allergies: Negative for polydipsia.  Psychiatric/Behavioral: Negative for depression.    Past Medical History:  Past Medical History:  Diagnosis Date  . Anxiety     Past Surgical History:  History reviewed. No pertinent surgical history.  Gynecologic History:  Patient's last menstrual period was 09/29/2019 (approximate). Contraception: NuvaRing vaginal inserts  Obstetric History: G1P1  Family History:  Family History  Problem Relation Age of Onset  . Breast cancer Maternal Grandmother 50   ovarian  . Breast cancer Maternal Aunt 30  . Ovarian cancer Maternal Aunt     Social History:  Social History   Socioeconomic History  . Marital status: Single    Spouse name: Not on file  . Number of children: Not on file  . Years of education: Not on file  . Highest education level: Not on file  Occupational History  . Not on file  Tobacco Use  . Smoking status: Never Smoker  . Smokeless tobacco: Never Used  Substance and Sexual Activity  . Alcohol use: Yes  . Drug use: No  . Sexual activity: Yes    Birth control/protection: None, Inserts  Other Topics Concern  . Not on file  Social History Narrative  . Not on file   Social Determinants of Health   Financial Resource Strain:   . Difficulty of Paying Living Expenses: Not on file  Food Insecurity:   . Worried About Programme researcher, broadcasting/film/video in the Last Year: Not on file  . Ran Out of Food in the Last Year: Not on file  Transportation Needs:   . Lack of Transportation (Medical): Not on file  . Lack of Transportation (Non-Medical): Not on file  Physical Activity:   . Days of Exercise per Week: Not on file  . Minutes of Exercise per Session: Not on file  Stress:   . Feeling of Stress : Not on file  Social Connections:   . Frequency of Communication with Friends and Family: Not on file  . Frequency of Social Gatherings with Friends and Family: Not on file  . Attends Religious Services: Not on file  . Active Member of Clubs or Organizations: Not on file  .  Attends Archivist Meetings: Not on file  . Marital Status: Not on file  Intimate Partner Violence:   . Fear of Current or Ex-Partner: Not on file  . Emotionally Abused: Not on file  . Physically Abused: Not on file  . Sexually Abused: Not on file    Allergies:  Allergies  Allergen Reactions  . Latex Swelling    Medications: Prior to Admission medications   Medication Sig Start Date End Date Taking? Authorizing Provider  etonogestrel-ethinyl  estradiol (NUVARING) 0.12-0.015 MG/24HR vaginal ring Insert vaginally and leave in place for 3 consecutive weeks, then remove for 1 week. 08/29/19  Yes Malachy Mood, MD    Physical Exam Vitals: Blood pressure 106/64, pulse 93, weight 221 lb (100.2 kg), last menstrual period 09/29/2019.  General: NAD HEENT: normocephalic, anicteric Thyroid: no enlargement, no palpable nodules Pulmonary: No increased work of breathing, CTAB Cardiovascular: RRR, distal pulses 2+ Breast: Breast symmetrical, no tenderness, no palpable nodules or masses, no skin or nipple retraction present, no nipple discharge.  No axillary or supraclavicular lymphadenopathy. Abdomen: NABS, soft, non-tender, non-distended.  Umbilicus without lesions.  No hepatomegaly, splenomegaly or masses palpable. No evidence of hernia  Genitourinary:  External: Normal external female genitalia.  Normal urethral meatus, normal Bartholin's and Skene's glands.    Vagina: Normal vaginal mucosa, no evidence of prolapse.    Cervix: Grossly normal in appearance, no bleeding  Uterus: Non-enlarged, mobile, normal contour.  No CMT  Adnexa: ovaries non-enlarged, no adnexal masses  Rectal: deferred  Lymphatic: no evidence of inguinal lymphadenopathy Extremities: no edema, erythema, or tenderness Neurologic: Grossly intact Psychiatric: mood appropriate, affect full  Female chaperone present for pelvic and breast  portions of the physical exam    Assessment: 29 y.o. G1P1 routine annual exam  Plan: Problem List Items Addressed This Visit    None    Visit Diagnoses    Encounter for surveillance of vaginal ring hormonal contraceptive device    -  Primary   Encounter for gynecological examination without abnormal finding       Relevant Orders   PapIG, CtNgTv, HPV, rfx 16/18   HEP, RPR, HIV Panel   Screening for malignant neoplasm of cervix       Relevant Orders   PapIG, CtNgTv, HPV, rfx 16/18   Breast screening       Routine screening  for STI (sexually transmitted infection)       Relevant Orders   PapIG, CtNgTv, HPV, rfx 16/18   HEP, RPR, HIV Panel      1) STI screening  wasoffered and accepted  2)  ASCCP guidelines and rational discussed.  Patient opts for every 3 years screening interval  3) Contraception - the patient is currently using  NuvaRing vaginal inserts.  She is happy with her current form of contraception and plans to continue  4) Routine healthcare maintenance including cholesterol, diabetes screening discussed managed by PCP  5) Return in about 1 year (around 10/03/2020) for annual.   Malachy Mood, MD, [place] 10/04/2019, 10:27 AM

## 2019-10-05 LAB — HEP, RPR, HIV PANEL
HIV Screen 4th Generation wRfx: NONREACTIVE
Hepatitis B Surface Ag: NEGATIVE
RPR Ser Ql: NONREACTIVE

## 2019-10-17 LAB — IGP,CTNGTV,APT HPV,RFX16/18,45
Chlamydia, Nuc. Acid Amp: NEGATIVE
Gonococcus, Nuc. Acid Amp: NEGATIVE
HPV Aptima: NEGATIVE
Trich vag by NAA: NEGATIVE

## 2020-10-08 ENCOUNTER — Other Ambulatory Visit: Payer: Self-pay

## 2020-10-08 MED ORDER — ETONOGESTREL-ETHINYL ESTRADIOL 0.12-0.015 MG/24HR VA RING: VAGINAL_RING | VAGINAL | 0 refills | Status: DC

## 2020-10-08 NOTE — Telephone Encounter (Signed)
Pt calling for refill of nuvaring; has appt Wed.  [phone]  Pt aware refill eRx'd.

## 2020-10-10 ENCOUNTER — Encounter: Payer: Self-pay | Admitting: Obstetrics and Gynecology

## 2020-10-10 ENCOUNTER — Other Ambulatory Visit: Payer: Self-pay

## 2020-10-10 ENCOUNTER — Ambulatory Visit (INDEPENDENT_AMBULATORY_CARE_PROVIDER_SITE_OTHER): Payer: Managed Care, Other (non HMO) | Admitting: Obstetrics and Gynecology

## 2020-10-10 VITALS — BP 112/78 | Ht 70.0 in | Wt 231.4 lb

## 2020-10-10 DIAGNOSIS — Z01419 Encounter for gynecological examination (general) (routine) without abnormal findings: Secondary | ICD-10-CM

## 2020-10-10 DIAGNOSIS — Z1239 Encounter for other screening for malignant neoplasm of breast: Secondary | ICD-10-CM | POA: Diagnosis not present

## 2020-10-10 DIAGNOSIS — Z3044 Encounter for surveillance of vaginal ring hormonal contraceptive device: Secondary | ICD-10-CM

## 2020-10-10 DIAGNOSIS — Z113 Encounter for screening for infections with a predominantly sexual mode of transmission: Secondary | ICD-10-CM | POA: Diagnosis not present

## 2020-10-10 MED ORDER — ETONOGESTREL-ETHINYL ESTRADIOL 0.12-0.015 MG/24HR VA RING: VAGINAL_RING | VAGINAL | 3 refills | Status: DC

## 2020-10-10 NOTE — Progress Notes (Signed)
Gynecology Annual Exam   PCP: Patient, No Pcp Per  Chief Complaint:  Chief Complaint  Patient presents with  . Gynecologic Exam    Annual - no concerns. RM 5    History of Present Illness: Patient is a 30 y.o. G1P1 presents for annual exam. The patient has no complaints today.   LMP: Patient's last menstrual period was 09/30/2020. Average Interval: regular monthly Duration of flow: 2-4 days Heavy Menses: no Clots: no Intermenstrual Bleeding: no Postcoital Bleeding: no Dysmenorrhea: no  The patient is sexually active. She currently uses NuvaRing vaginal inserts for contraception. She denies dyspareunia.  The patient does perform self breast exams.  There is no notable family history of breast or ovarian cancer in her family.  The patient wears seatbelts: yes.   The patient has regular exercise: not asked.    The patient denies current symptoms of depression.    Review of Systems: Review of Systems  Constitutional: Negative for chills and fever.  HENT: Negative for congestion.   Respiratory: Negative for cough and shortness of breath.   Cardiovascular: Negative for chest pain and palpitations.  Gastrointestinal: Negative for abdominal pain, constipation, diarrhea, heartburn, nausea and vomiting.  Genitourinary: Negative for dysuria, frequency and urgency.  Skin: Negative for itching and rash.  Neurological: Negative for dizziness and headaches.  Endo/Heme/Allergies: Negative for polydipsia.  Psychiatric/Behavioral: Negative for depression.    Past Medical History:  Patient Active Problem List   Diagnosis Date Noted  . Skin cyst 04/09/2016    Past Surgical History:  History reviewed. No pertinent surgical history.  Gynecologic History:  Patient's last menstrual period was 09/30/2020. Contraception: NuvaRing vaginal inserts Last Pap: Results were:10/04/2019 NIL and HR HPV negative   Obstetric History: G1P1  Family History:  Family History  Problem Relation  Age of Onset  . Breast cancer Maternal Grandmother 50       ovarian  . Breast cancer Maternal Aunt 30  . Ovarian cancer Maternal Aunt     Social History:  Social History   Socioeconomic History  . Marital status: Single    Spouse name: Not on file  . Number of children: Not on file  . Years of education: Not on file  . Highest education level: Not on file  Occupational History  . Not on file  Tobacco Use  . Smoking status: Never Smoker  . Smokeless tobacco: Never Used  Vaping Use  . Vaping Use: Never used  Substance and Sexual Activity  . Alcohol use: Yes  . Drug use: No  . Sexual activity: Yes    Birth control/protection: None, Inserts  Other Topics Concern  . Not on file  Social History Narrative  . Not on file   Social Determinants of Health   Financial Resource Strain: Not on file  Food Insecurity: Not on file  Transportation Needs: Not on file  Physical Activity: Not on file  Stress: Not on file  Social Connections: Not on file  Intimate Partner Violence: Not on file    Allergies:  Allergies  Allergen Reactions  . Latex Swelling    Medications: Prior to Admission medications   Medication Sig Start Date End Date Taking? Authorizing Provider  etonogestrel-ethinyl estradiol (NUVARING) 0.12-0.015 MG/24HR vaginal ring Insert vaginally and leave in place for 3 consecutive weeks, then remove for 1 week. 10/08/20  Yes Vena Austria, MD    Physical Exam Vitals: Blood pressure 112/78, height 5\' 10"  (1.778 m), weight 231 lb 6 oz (105 kg),  last menstrual period 09/30/2020.  General: NAD HEENT: normocephalic, anicteric Thyroid: no enlargement, no palpable nodules Pulmonary: No increased work of breathing, CTAB Cardiovascular: RRR, distal pulses 2+ Breast: Breast symmetrical, no tenderness, no palpable nodules or masses, no skin or nipple retraction present, no nipple discharge.  No axillary or supraclavicular lymphadenopathy. Abdomen: NABS, soft,  non-tender, non-distended.  Umbilicus without lesions.  No hepatomegaly, splenomegaly or masses palpable. No evidence of hernia  Genitourinary:  External: Normal external female genitalia.  Normal urethral meatus, normal Bartholin's and Skene's glands.    Vagina: Normal vaginal mucosa, no evidence of prolapse.    Cervix: Grossly normal in appearance, no bleeding  Uterus: Non-enlarged, mobile, normal contour.  No CMT  Adnexa: ovaries non-enlarged, no adnexal masses  Rectal: deferred  Lymphatic: no evidence of inguinal lymphadenopathy Extremities: no edema, erythema, or tenderness Neurologic: Grossly intact Psychiatric: mood appropriate, affect full  Female chaperone present for pelvic and breast  portions of the physical exam  Immunization History  Administered Date(s) Administered  . PFIZER(Purple Top)SARS-COV-2 Vaccination 02/09/2020, 03/02/2020    Assessment: 30 y.o. G1P1 routine annual exam  Plan: Problem List Items Addressed This Visit   None   Visit Diagnoses    Encounter for gynecological examination without abnormal finding    -  Primary   Breast screening       Encounter for surveillance of vaginal ring hormonal contraceptive device       Routine screening for STI (sexually transmitted infection)       Relevant Orders   Cervicovaginal ancillary only   HEP, RPR, HIV Panel      2) STI screening  wasoffered and accepted  2)  ASCCP guidelines and rational discussed.  Patient opts for every 3 years screening interval  3) Contraception - the patient is currently using  NuvaRing vaginal inserts.  She is happy with her current form of contraception and plans to continue  4) Routine healthcare maintenance including cholesterol, diabetes screening discussed managed by PCP  5) Return in about 1 year (around 10/10/2021) for annual.   Vena Austria, MD, [facility], [facility] 10/10/2020, 8:56 AM

## 2020-10-10 NOTE — Addendum Note (Signed)
Addended by: Lorrene Reid on: 10/10/2020 11:36 AM   Modules accepted: Orders

## 2020-10-11 LAB — HEP, RPR, HIV PANEL
HIV Screen 4th Generation wRfx: NONREACTIVE
Hepatitis B Surface Ag: NEGATIVE
RPR Ser Ql: NONREACTIVE

## 2020-10-12 LAB — GC/CHLAMYDIA PROBE AMP
Chlamydia trachomatis, NAA: NEGATIVE
Neisseria Gonorrhoeae by PCR: NEGATIVE

## 2021-12-03 ENCOUNTER — Other Ambulatory Visit: Payer: Self-pay

## 2021-12-03 ENCOUNTER — Emergency Department
Admission: EM | Admit: 2021-12-03 | Discharge: 2021-12-03 | Disposition: A | Discharge status: Home / Self Care | Payer: No Typology Code available for payment source | Attending: Emergency Medicine | Admitting: Emergency Medicine

## 2021-12-03 ENCOUNTER — Encounter: Payer: Self-pay | Admitting: Emergency Medicine

## 2021-12-03 DIAGNOSIS — K2100 Gastro-esophageal reflux disease with esophagitis, without bleeding: Secondary | ICD-10-CM | POA: Diagnosis not present

## 2021-12-03 DIAGNOSIS — J9801 Acute bronchospasm: Secondary | ICD-10-CM | POA: Insufficient documentation

## 2021-12-03 DIAGNOSIS — R0789 Other chest pain: Secondary | ICD-10-CM | POA: Diagnosis present

## 2021-12-03 LAB — CBC
HCT: 41.3 % (ref 36.0–46.0)
Hemoglobin: 13 g/dL (ref 12.0–15.0)
MCH: 27.6 pg (ref 26.0–34.0)
MCHC: 31.5 g/dL (ref 30.0–36.0)
MCV: 87.7 fL (ref 80.0–100.0)
Platelets: 255 10*3/uL (ref 150–400)
RBC: 4.71 MIL/uL (ref 3.87–5.11)
RDW: 13.1 % (ref 11.5–15.5)
WBC: 4.9 10*3/uL (ref 4.0–10.5)
nRBC: 0 % (ref 0.0–0.2)

## 2021-12-03 LAB — BASIC METABOLIC PANEL
Anion gap: 9 (ref 5–15)
BUN: 11 mg/dL (ref 6–20)
CO2: 22 mmol/L (ref 22–32)
Calcium: 8.8 mg/dL — ABNORMAL LOW (ref 8.9–10.3)
Chloride: 109 mmol/L (ref 98–111)
Creatinine, Ser: 0.62 mg/dL (ref 0.44–1.00)
GFR, Estimated: >60 mL/min (ref >60)
Glucose, Bld: 108 mg/dL — ABNORMAL HIGH (ref 70–99)
Potassium: 3.4 mmol/L — ABNORMAL LOW (ref 3.5–5.1)
Sodium: 140 mmol/L (ref 135–145)

## 2021-12-03 LAB — TROPONIN I (HIGH SENSITIVITY): Troponin I (High Sensitivity): 3 ng/L (ref <18)

## 2021-12-03 MED ORDER — IPRATROPIUM-ALBUTEROL 0.5-2.5 (3) MG/3ML IN SOLN
3.0000 mL | Freq: Once | RESPIRATORY_TRACT | Status: AC
  Administered 2021-12-03: 3 mL via RESPIRATORY_TRACT
  Filled 2021-12-03: qty 3

## 2021-12-03 MED ORDER — PREDNISONE 20 MG PO TABS
60.0000 mg | ORAL_TABLET | Freq: Once | ORAL | Status: AC
  Administered 2021-12-03: 60 mg via ORAL
  Filled 2021-12-03: qty 3

## 2021-12-03 MED ORDER — ESOMEPRAZOLE MAGNESIUM 40 MG PO CPDR: 40.0000 mg | DELAYED_RELEASE_CAPSULE | Freq: Every day | ORAL | 1 refills | Status: DC

## 2021-12-03 MED ORDER — PREDNISONE 50 MG PO TABS: 50.0000 mg | ORAL_TABLET | Freq: Every day | ORAL | 0 refills | Status: DC

## 2021-12-03 NOTE — ED Provider Notes (Signed)
? ?[facility] ?Provider Note ? ? ? Event Date/Time  ? First MD Initiated Contact with Patient 12/03/21 1321   ?  (approximate) ? ? ?History  ? ?Chest Pain and Shortness of Breath ? ? ?HPI ? ?Sheila Gilbert is a 31 y.o. female with no significant past medical history who presents with complaints of tightness in her chest and mild shortness of breath.  Occasional cough noted.  Also has a mild scratchy throat.  She reports has been having problems with acid reflux over the last month as well.  No fevers or chills.  Recently had an x-ray which was negative ?  ? ? ?Physical Exam  ? ?Triage Vital Signs: ?ED Triage Vitals  ?Enc Vitals Group  ?   BP 12/03/21 1411 138/78  ?   Pulse Rate 12/03/21 1411 88  ?   Resp 12/03/21 1411 20  ?   Temp 12/03/21 1411 98 ?F (36.7 ?C)  ?   Temp Source 12/03/21 1411 Oral  ?   SpO2 12/03/21 1411 97 %  ?   Weight 12/03/21 1244 123.7 kg (272 lb 9.6 oz)  ?   Height 12/03/21 1244 1.778 m (5\' 10" )  ?   Head Circumference --   ?   Peak Flow --   ?   Pain Score 12/03/21 1244 7  ?   Pain Loc --   ?   Pain Edu? --   ?   Excl. in GC? --   ? ? ?Most recent vital signs: ?Vitals:  ? 12/03/21 1411  ?BP: 138/78  ?Pulse: 88  ?Resp: 20  ?Temp: 98 ?F (36.7 ?C)  ?SpO2: 97%  ? ? ? ?General: Awake, no distress.  ?CV:  Good peripheral perfusion.  Scattered mild wheezes ?Resp:  Normal effort.  ?Abd:  No distention.  ?Other:  No calf pain or swelling ? ? ?ED Results / Procedures / Treatments  ? ?Labs ?(all labs ordered are listed, but only abnormal results are displayed) ?Labs Reviewed  ?BASIC METABOLIC PANEL - Abnormal; Notable for the following components:  ?    Result Value  ? Potassium 3.4 (*)   ? Glucose, Bld 108 (*)   ? Calcium 8.8 (*)   ? All other components within normal limits  ?CBC  ?POC URINE PREG, ED  ?TROPONIN I (HIGH SENSITIVITY)  ? ? ? ?EKG ? ?ED ECG REPORT ?I, 12/05/21, the attending physician, personally viewed and interpreted this ECG. ? ?Date: 12/03/2021 ? ?Rhythm:  normal sinus rhythm ?QRS Axis: normal ?Intervals: normal ?ST/T Wave abnormalities: normal ?Narrative Interpretation: no evidence of acute ischemia ? ? ? ?RADIOLOGY ? ? ? ? ?PROCEDURES: ? ?Critical Care performed:  ? ?Procedures ? ? ?MEDICATIONS ORDERED IN ED: ?Medications  ?ipratropium-albuterol (DUONEB) 0.5-2.5 (3) MG/3ML nebulizer solution 3 mL (3 mLs Nebulization Given 12/03/21 1425)  ?ipratropium-albuterol (DUONEB) 0.5-2.5 (3) MG/3ML nebulizer solution 3 mL (3 mLs Nebulization Given 12/03/21 1425)  ?predniSONE (DELTASONE) tablet 60 mg (60 mg Oral Given 12/03/21 1424)  ? ? ? ?IMPRESSION / MDM / ASSESSMENT AND PLAN / ED COURSE  ?I reviewed the triage vital signs and the nursing notes. ? ?Patient well-appearing and in no acute distress.  Her voice appears to be slightly scratchy.  Her description of acid reflux over the last month could be the cause of her scratchy voice and chest discomfort. ? ?I also suspect component of bronchospasm, patient treated with DuoNeb and prednisone in the emergency department with significant improvement. ? ?We will continue  prednisone, I have prescribed Nexium for acid reflux, recommended GI follow-up ? ? ? ? ? ?  ? ? ?FINAL CLINICAL IMPRESSION(S) / ED DIAGNOSES  ? ?Final diagnoses:  ?Bronchospasm  ?Gastroesophageal reflux disease with esophagitis without hemorrhage  ? ? ? ?Rx / DC Orders  ? ?ED Discharge Orders   ? ?      Ordered  ?  predniSONE (DELTASONE) 50 MG tablet  Daily with breakfast       ? 12/03/21 1454  ?  esomeprazole (NEXIUM) 40 MG capsule  Daily       ? 12/03/21 1454  ? ?  ?  ? ?  ? ? ? ?Note:  This document was prepared using Dragon voice recognition software and may include unintentional dictation errors. ?  ?Jene Every, MD ?12/03/21 1510 ? ?

## 2021-12-03 NOTE — ED Notes (Signed)
Pt states she was seen at urgent care earlier today and given a albuterol treatment and feels worse now.  ?

## 2021-12-03 NOTE — ED Notes (Signed)
Pt was not able to provide a urine.  ?

## 2021-12-03 NOTE — ED Triage Notes (Signed)
Pt to ED via POV with [facility] and chest pain for the last few days, they did a chest xray and COVID test today at urgent care. They gave her an RX for albuterol and she reports that it made her symptoms worse.  ?

## 2022-07-28 ENCOUNTER — Telehealth: Payer: Self-pay

## 2022-07-28 DIAGNOSIS — E6609 Other obesity due to excess calories: Secondary | ICD-10-CM | POA: Diagnosis not present

## 2022-07-28 DIAGNOSIS — Z6839 Body mass index (BMI) 39.0-39.9, adult: Secondary | ICD-10-CM | POA: Diagnosis not present

## 2022-07-28 DIAGNOSIS — E7800 Pure hypercholesterolemia, unspecified: Secondary | ICD-10-CM | POA: Diagnosis not present

## 2022-07-28 NOTE — Telephone Encounter (Signed)
Pt calling; wants to go back on nuvaring; does she need an appt or can it just be refilled?  [phone]

## 2022-08-06 NOTE — Telephone Encounter (Signed)
Patient is scheduled for 09/25/21 at 8:35 am with [facility]. Patient is requesting Nuvaring refill.

## 2022-08-06 NOTE — Telephone Encounter (Signed)
Pt hasn't been seen since 2/22. Who has RF nuvaring this yr since over a yr past due?

## 2022-08-07 ENCOUNTER — Other Ambulatory Visit: Payer: Self-pay | Admitting: Obstetrics and Gynecology

## 2022-08-07 MED ORDER — ETONOGESTREL-ETHINYL ESTRADIOL 0.12-0.015 MG/24HR VA RING: VAGINAL_RING | VAGINAL | 0 refills | Status: DC

## 2022-08-07 NOTE — Progress Notes (Signed)
Rx RF nuvaring, wants to restart BC. Has annual 1/24.

## 2022-08-07 NOTE — Telephone Encounter (Signed)
Rx RF eRxd.  

## 2022-09-09 DIAGNOSIS — A084 Viral intestinal infection, unspecified: Secondary | ICD-10-CM | POA: Diagnosis not present

## 2022-09-09 NOTE — Telephone Encounter (Signed)
Received fax that PA needs to be submitted for nuvaring. Rx was sent on 08/07/22, did patient ever get it? Called pt to get info so I can submit PA, no answer, LVMTRC.

## 2022-09-25 ENCOUNTER — Ambulatory Visit: Payer: Self-pay | Admitting: Obstetrics and Gynecology

## 2022-10-29 NOTE — Progress Notes (Unsigned)
   PCP:  Pcp, No   No chief complaint on file.    HPI:      [name] is a 32 y.o. G1P1 whose LMP was No LMP recorded., presents today for her annual examination.  Her menses are regular every 28-30 days, lasting 4 days.  Dysmenorrhea {dysmen:716}. She {does:18564} have intermenstrual bleeding.  Sex activity: single partner, contraception - NuvaRing vaginal inserts.  Last Pap: 10/04/19 Results were: no abnormalities /neg HPV DNA   DOES STD TEINSG? Hx of STDs: {STD hx:14358}  Last mammogram: {date:304500300}  Results were: {norm/abn:13465} There is a FH of breast cancer in her mat aunt****. There is no FH of ovarian cancer. The patient {does:18564} do self-breast exams.  Tobacco use: {tob:20664} Alcohol use: {Alcohol:11675} No drug use.  Exercise: {exercise:31265}  She {does:18564} get adequate calcium and Vitamin D in her diet.  Patient Active Problem List   Diagnosis Date Noted   Skin cyst 04/09/2016    No past surgical history on file.  Family History  Problem Relation Age of Onset   Breast cancer Maternal Grandmother 41       ovarian   Breast cancer Maternal Aunt 30   Ovarian cancer Maternal Aunt     Social History   Socioeconomic History   Marital status: Single    Spouse name: Not on file   Number of children: Not on file   Years of education: Not on file   Highest education level: Not on file  Occupational History   Not on file  Tobacco Use   Smoking status: Never   Smokeless tobacco: Never  Vaping Use   Vaping Use: Never used  Substance and Sexual Activity   Alcohol use: Yes   Drug use: No   Sexual activity: Yes    Birth control/protection: None, Inserts  Other Topics Concern   Not on file  Social History Narrative   Not on file   Social Determinants of Health   Financial Resource Strain: Not on file  Food Insecurity: Not on file  Transportation Needs: Not on file  Physical Activity: Not on file  Stress: Not on file  Social  Connections: Not on file  Intimate Partner Violence: Not on file     Current Outpatient Medications:    esomeprazole (NEXIUM) 40 MG capsule, Take 1 capsule (40 mg total) by mouth daily., Disp: 30 capsule, Rfl: 1   etonogestrel-ethinyl estradiol (NUVARING) 0.12-0.015 MG/24HR vaginal ring, Insert vaginally and leave in place for 3 consecutive weeks, then remove for 1 week., Disp: 3 each, Rfl: 0   predniSONE (DELTASONE) 50 MG tablet, Take 1 tablet (50 mg total) by mouth daily with breakfast., Disp: 4 tablet, Rfl: 0     ROS:  Review of Systems BREAST: No symptoms   Objective: There were no vitals taken for this visit.   OBGyn Exam  Results: No results found for this or any previous visit (from the past 24 hour(s)).  Assessment/Plan: No diagnosis found.  No orders of the defined types were placed in this encounter.            GYN counsel {counseling: 16159}     F/U  No follow-ups on file.  Landrey Mahurin B. Jasline Buskirk, PA-C 10/29/2022 8:58 PM

## 2022-10-30 ENCOUNTER — Encounter: Payer: Self-pay | Admitting: Obstetrics and Gynecology

## 2022-10-30 ENCOUNTER — Ambulatory Visit (INDEPENDENT_AMBULATORY_CARE_PROVIDER_SITE_OTHER): Payer: BC Managed Care – PPO | Admitting: Obstetrics and Gynecology

## 2022-10-30 VITALS — BP 122/88 | Ht 70.0 in | Wt 280.0 lb

## 2022-10-30 DIAGNOSIS — Z803 Family history of malignant neoplasm of breast: Secondary | ICD-10-CM | POA: Insufficient documentation

## 2022-10-30 DIAGNOSIS — O24410 Gestational diabetes mellitus in pregnancy, diet controlled: Secondary | ICD-10-CM | POA: Diagnosis not present

## 2022-10-30 DIAGNOSIS — Z8041 Family history of malignant neoplasm of ovary: Secondary | ICD-10-CM | POA: Insufficient documentation

## 2022-10-30 DIAGNOSIS — K2100 Gastro-esophageal reflux disease with esophagitis, without bleeding: Secondary | ICD-10-CM | POA: Diagnosis not present

## 2022-10-30 DIAGNOSIS — Z3044 Encounter for surveillance of vaginal ring hormonal contraceptive device: Secondary | ICD-10-CM

## 2022-10-30 DIAGNOSIS — E7800 Pure hypercholesterolemia, unspecified: Secondary | ICD-10-CM | POA: Diagnosis not present

## 2022-10-30 DIAGNOSIS — Z0001 Encounter for general adult medical examination with abnormal findings: Secondary | ICD-10-CM | POA: Diagnosis not present

## 2022-10-30 DIAGNOSIS — Z01411 Encounter for gynecological examination (general) (routine) with abnormal findings: Secondary | ICD-10-CM | POA: Diagnosis not present

## 2022-10-30 DIAGNOSIS — Z01419 Encounter for gynecological examination (general) (routine) without abnormal findings: Secondary | ICD-10-CM

## 2022-10-30 DIAGNOSIS — L732 Hidradenitis suppurativa: Secondary | ICD-10-CM | POA: Diagnosis not present

## 2022-10-30 MED ORDER — ETONOGESTREL-ETHINYL ESTRADIOL 0.12-0.015 MG/24HR VA RING: VAGINAL_RING | VAGINAL | 3 refills | Status: DC

## 2022-10-30 NOTE — Patient Instructions (Signed)
I value your feedback and you entrusting us with your care. If you get a [facility] patient survey, I would appreciate you taking the time to let us know about your experience today. Thank you! ? ? ?

## 2022-11-06 DIAGNOSIS — J4520 Mild intermittent asthma, uncomplicated: Secondary | ICD-10-CM | POA: Diagnosis not present

## 2022-11-06 DIAGNOSIS — K2100 Gastro-esophageal reflux disease with esophagitis, without bleeding: Secondary | ICD-10-CM | POA: Diagnosis not present

## 2022-11-06 DIAGNOSIS — E7800 Pure hypercholesterolemia, unspecified: Secondary | ICD-10-CM | POA: Diagnosis not present

## 2022-11-06 DIAGNOSIS — E6609 Other obesity due to excess calories: Secondary | ICD-10-CM | POA: Diagnosis not present

## 2022-11-06 DIAGNOSIS — Z1331 Encounter for screening for depression: Secondary | ICD-10-CM | POA: Diagnosis not present

## 2022-11-06 DIAGNOSIS — Z6841 Body Mass Index (BMI) 40.0 and over, adult: Secondary | ICD-10-CM | POA: Diagnosis not present

## 2022-11-06 DIAGNOSIS — R7303 Prediabetes: Secondary | ICD-10-CM | POA: Diagnosis not present

## 2022-12-11 DIAGNOSIS — J209 Acute bronchitis, unspecified: Secondary | ICD-10-CM | POA: Diagnosis not present

## 2022-12-11 DIAGNOSIS — R070 Pain in throat: Secondary | ICD-10-CM | POA: Diagnosis not present

## 2023-01-28 DIAGNOSIS — Z0001 Encounter for general adult medical examination with abnormal findings: Secondary | ICD-10-CM | POA: Diagnosis not present

## 2023-01-28 DIAGNOSIS — R7303 Prediabetes: Secondary | ICD-10-CM | POA: Diagnosis not present

## 2023-01-28 DIAGNOSIS — E7800 Pure hypercholesterolemia, unspecified: Secondary | ICD-10-CM | POA: Diagnosis not present

## 2023-01-28 DIAGNOSIS — K2100 Gastro-esophageal reflux disease with esophagitis, without bleeding: Secondary | ICD-10-CM | POA: Diagnosis not present

## 2023-02-06 DIAGNOSIS — O24410 Gestational diabetes mellitus in pregnancy, diet controlled: Secondary | ICD-10-CM | POA: Diagnosis not present

## 2023-02-06 DIAGNOSIS — Z6841 Body Mass Index (BMI) 40.0 and over, adult: Secondary | ICD-10-CM | POA: Diagnosis not present

## 2023-02-06 DIAGNOSIS — E7800 Pure hypercholesterolemia, unspecified: Secondary | ICD-10-CM | POA: Diagnosis not present

## 2023-02-06 DIAGNOSIS — E6609 Other obesity due to excess calories: Secondary | ICD-10-CM | POA: Diagnosis not present

## 2023-02-06 DIAGNOSIS — J4520 Mild intermittent asthma, uncomplicated: Secondary | ICD-10-CM | POA: Diagnosis not present

## 2023-02-13 ENCOUNTER — Ambulatory Visit: Payer: No Typology Code available for payment source | Admitting: Dietician

## 2023-03-06 ENCOUNTER — Encounter: Payer: Self-pay | Admitting: Dietician

## 2023-03-06 ENCOUNTER — Encounter: Payer: BC Managed Care – PPO | Attending: Endocrinology | Admitting: Dietician

## 2023-03-06 VITALS — Ht 70.0 in | Wt 280.9 lb

## 2023-03-06 DIAGNOSIS — E7800 Pure hypercholesterolemia, unspecified: Secondary | ICD-10-CM | POA: Insufficient documentation

## 2023-03-06 DIAGNOSIS — E6609 Other obesity due to excess calories: Secondary | ICD-10-CM | POA: Insufficient documentation

## 2023-03-06 DIAGNOSIS — Z713 Dietary counseling and surveillance: Secondary | ICD-10-CM | POA: Diagnosis not present

## 2023-03-06 DIAGNOSIS — K2100 Gastro-esophageal reflux disease with esophagitis, without bleeding: Secondary | ICD-10-CM | POA: Diagnosis not present

## 2023-03-06 DIAGNOSIS — E669 Obesity, unspecified: Secondary | ICD-10-CM

## 2023-03-06 NOTE — Progress Notes (Signed)
Medical Nutrition Therapy  Appointment Start time:  [phone]  Appointment End time:  0920  Primary concerns today: Weight Loss  Referral diagnosis: K21.00 - GERD, E78.00 - Mixed HLD, E66.09 - Obesity Preferred learning style: No preference indicated Learning readiness: Ready   NUTRITION ASSESSMENT   Anthropometrics  Ht: 5'10" Wt: 280.9 lbs BMI: 40.30 kg/m2  Clinical Medical Hx: Prediabetes, HLD, Medications: Buspar, Lexapro Labs (10/30/2022): A1c - 6.2%, TC - 206, HDL - 36 (low), LDL - 146 (high) Notable Signs/Symptoms: N/A   Lifestyle & Dietary Hx Pt reports desire to improve overall health, increase cardiovascular endurance, lose weight. Pt reports ~100 lb weight gain over the last couple of years. Pt states they live with their husband and 47 year old child, reports splitting cooking responsibilities with husband (70/30, husband/pt), states that husband is a Investment banker, operational, cooks with a lot heavy cream and butter, makes large portions for meals. Pt reports changing jobs ~3 years ago to a much more sedentary work environment Pt reports starting to exercise in the mornings with coworkers M-F, ~30-45 minutes. Exercises include cardio, light resistance training. Pt reports fatigue after exercise, brain fog as well, pt states they are not eating   Pt reports getting off of dietary routine on the weekends, eats 3 meals daily during the work.   Estimated daily fluid intake: 48 oz Supplements: None Sleep: Sleeps well Stress / self-care: Varying stress with work Current average weekly physical activity: ADLs, very sedentary at work   24-Hr Dietary Recall First Meal: 2 breakfast tacos w/ scrambled egg, vegan chorizo, cheese, dices peppers and onions, coffee Snack: none Second Meal: Chicken salad sandwich,  Snack: none Third Meal: Baked flounder, zucchini, squash, olive oil, mashed potatoes, salad w/tomato, cucumber, croutons, champagne vinagerette, 2 Oreos Snack: none Beverages: Water,     NUTRITION DIAGNOSIS  NB-1.7 Undesireable food choices As related to obesity.  As evidenced by BMI of 40.30 kg/m2, dietary recall high in fat, .   NUTRITION INTERVENTION  Nutrition education (E-1) on the following topics:  Educated patient on the balanced plate eating model. Recommended lunch and dinner be 1/2 non-starchy vegetables, 1/4 starches, and 1/4 protein. Recommended breakfast be a balance of starch and protein with a piece of fruit. Discussed with patient the importance of working towards hitting the proportions of the balanced plate consistently. Counseled patient on ways to begin recognizing each of the food groups from the balanced plate in their own meals, and how close they are to fitting the recommended proportions of the balanced plate. Educated patient on the nutritional value of each food group on the balanced plate model. Educated patient on the 3 macronutrients, their calorie content, and their role in our body. Helped patient identify sources of each in their diet.  Handouts Provided Include  Balanced Plate Balanced Plate Food List Eating Before Exercise Protein List  Learning Style & Readiness for Change Teaching method utilized: Visual & Auditory  Demonstrated degree of understanding via: Teach Back  Barriers to learning/adherence to lifestyle change: none  Goals Established by Pt Have a small carbohydrate snack before exercising in the morning. Use your "Eating Before Exercise" handout for some food choices to have before your morning exercise. Keep up the great eating three meals a day, about 5-6 hours apart! Begin to recognize carbohydrates, proteins, and non-starchy vegetables in your food choices! Begin to build your meals using the proportions of the Balanced Plate. First, select your carb choice(s) for the meal. Make this 25% of your meal. Next,  select your source of protein to pair with your carb choice(s). Make this another 25% of your meal. Finally,  complete your meal with a variety of non-starchy vegetables. Make this the remaining 50% of your meal. Continue to choose low fat (lean) proteins as often as possible!!   MONITORING & EVALUATION Dietary intake, weekly physical activity, and weight loss in 2 months.  Next Steps  Patient is to follow up with RD.

## 2023-03-06 NOTE — Patient Instructions (Addendum)
Have a small carbohydrate snack before exercising in the morning. Use your "Eating Before Exercise" handout for some food choices to have before your morning exercise.  Keep up the great eating three meals a day, about 5-6 hours apart!  Begin to recognize carbohydrates, proteins, and non-starchy vegetables in your food choices!  Begin to build your meals using the proportions of the Balanced Plate. First, select your carb choice(s) for the meal. Make this 25% of your meal. Next, select your source of protein to pair with your carb choice(s). Make this another 25% of your meal. Finally, complete your meal with a variety of non-starchy vegetables. Make this the remaining 50% of your meal.  Continue to choose low fat (lean) proteins as often as possible!!

## 2023-05-08 ENCOUNTER — Encounter: Payer: BC Managed Care – PPO | Attending: Endocrinology | Admitting: Dietician

## 2023-05-08 VITALS — Ht 70.0 in | Wt 285.5 lb

## 2023-05-08 DIAGNOSIS — E669 Obesity, unspecified: Secondary | ICD-10-CM | POA: Insufficient documentation

## 2023-05-08 NOTE — Progress Notes (Signed)
Medical Nutrition Therapy  Appointment Start time:  [phone]  Appointment End time:  0920  Primary concerns today: Weight Loss  Referral diagnosis: K21.00 - GERD, E78.00 - Mixed HLD, E66.09 - Obesity Preferred learning style: No preference indicated Learning readiness: Ready   NUTRITION ASSESSMENT   Anthropometrics  Ht: 5'10" Wt: 285.5 lbs BMI: 40.96 kg/m2 Wt Change: +4.6 lbs in 2 months  Clinical Medical Hx: Prediabetes, HLD, Medications: Buspar, Lexapro Labs (10/30/2022): A1c - 6.2%, TC - 206, HDL - 36 (low), LDL - 146 (high) Notable Signs/Symptoms: N/A   Lifestyle & Dietary Hx Pt reports taking in their stepmother with S3 cancer, states it has disrupted their eating and exercise schedule. Pt reports that this is supposed to be a temporary thing. Pt reports adding carbohydrates in the mornings and states it helped to keep them sharper early in the day. Pt reports energy crash in the afternoon, around 2:00 PM, tried coffee and candy to help, but neither helped. Pt states that they get up and walk around it helps slightly. Pt reports actively looking to purchase a walking pad to increase activity at work.  Pt reports trying to eat smaller meals, and trying to convince husband to incorporate more vegetables. Pt reports improvement in reflux until recently when eating out a lot, but overall well controlled.   Estimated daily fluid intake: 48 oz Supplements: None Sleep: Sleeps well Stress / self-care: Currently higher with living situations, reading more to  Current average weekly physical activity: ADLs, very sedentary at work   24-Hr Dietary Recall First Meal: Breakfast burrito, Low carb tortilla, Scrambled eggs, peppers, mushrooms, cheese, iced coffee Snack:  Second Meal: (Usually leftovers) Spaghetti squash, [place] meatballs, sauce Snack: none Third Meal: Broccoli and rice, teriyaki chicken Snack: none Beverages: Water, iced coffee, zero sugar soda   NUTRITION DIAGNOSIS   NB-1.7 Undesireable food choices As related to obesity.  As evidenced by BMI of 40.30 kg/m2, dietary recall high in fat, .   NUTRITION INTERVENTION  Nutrition education (E-1) on the following topics:  Educated patient on the balanced plate eating model. Recommended lunch and dinner be 1/2 non-starchy vegetables, 1/4 starches, and 1/4 protein. Recommended breakfast be a balance of starch and protein with a piece of fruit. Discussed with patient the importance of working towards hitting the proportions of the balanced plate consistently. Counseled patient on ways to begin recognizing each of the food groups from the balanced plate in their own meals, and how close they are to fitting the recommended proportions of the balanced plate. Educated patient on the nutritional value of each food group on the balanced plate model. Educated patient on the 3 macronutrients, their calorie content, and their role in our body. Helped patient identify sources of each in their diet. Educated pt on insulin resistance, and potential role of high fat meals in afternoon fatigue  Handouts Provided Include  Balanced Plate Balanced Plate Food List Eating Before Exercise Protein List  Learning Style & Readiness for Change Teaching method utilized: Visual & Auditory  Demonstrated degree of understanding via: Teach Back  Barriers to learning/adherence to lifestyle change: none  Goals Established by Pt As the weather changes, take a short walk after your lunch to combat the early afternoon fatigue. Try having a balanced snack each week day around 10:00 am and see if it helps to improve energy levels after lunch. (Boiled egg, whole wheat crackers ~15g carbs) Continue to narrow down your choice for a walking pad, use this at work every  hour for up to 30 minutes.   MONITORING & EVALUATION Dietary intake, weekly physical activity, and weight loss in 2 months.  Next Steps  Patient is to follow up with RD.

## 2023-05-08 NOTE — Patient Instructions (Addendum)
As the weather changes, take a short walk after your lunch to combat the early afternoon fatigue.  Try having a balanced snack each week day around 10:00 am and see if it helps to improve energy levels after lunch. (Boiled egg, whole wheat crackers ~15g carbs)  Continue to narrow down your choice for a walking pad, use this at work every hour for up to 30 minutes.

## 2023-07-17 ENCOUNTER — Ambulatory Visit: Payer: No Typology Code available for payment source | Admitting: Dietician

## 2023-07-24 DIAGNOSIS — J029 Acute pharyngitis, unspecified: Secondary | ICD-10-CM | POA: Diagnosis not present

## 2023-07-24 DIAGNOSIS — J069 Acute upper respiratory infection, unspecified: Secondary | ICD-10-CM | POA: Diagnosis not present

## 2023-07-24 DIAGNOSIS — J3489 Other specified disorders of nose and nasal sinuses: Secondary | ICD-10-CM | POA: Diagnosis not present

## 2023-07-31 ENCOUNTER — Encounter: Payer: Self-pay | Admitting: Physician Assistant

## 2023-07-31 ENCOUNTER — Ambulatory Visit: Payer: BC Managed Care – PPO | Admitting: Physician Assistant

## 2023-07-31 VITALS — BP 140/96 | HR 83 | Temp 98.2°F | Resp 96 | Ht 70.0 in | Wt 284.0 lb

## 2023-07-31 DIAGNOSIS — L732 Hidradenitis suppurativa: Secondary | ICD-10-CM

## 2023-07-31 DIAGNOSIS — E66812 Obesity, class 2: Secondary | ICD-10-CM | POA: Insufficient documentation

## 2023-07-31 DIAGNOSIS — R030 Elevated blood-pressure reading, without diagnosis of hypertension: Secondary | ICD-10-CM

## 2023-07-31 DIAGNOSIS — Z13220 Encounter for screening for lipoid disorders: Secondary | ICD-10-CM | POA: Diagnosis not present

## 2023-07-31 DIAGNOSIS — Z23 Encounter for immunization: Secondary | ICD-10-CM | POA: Diagnosis not present

## 2023-07-31 DIAGNOSIS — E8881 Metabolic syndrome: Secondary | ICD-10-CM

## 2023-07-31 DIAGNOSIS — E66811 Obesity, class 1: Secondary | ICD-10-CM | POA: Insufficient documentation

## 2023-07-31 DIAGNOSIS — R7303 Prediabetes: Secondary | ICD-10-CM | POA: Diagnosis not present

## 2023-07-31 DIAGNOSIS — E785 Hyperlipidemia, unspecified: Secondary | ICD-10-CM

## 2023-07-31 DIAGNOSIS — E66813 Obesity, class 3: Secondary | ICD-10-CM | POA: Diagnosis not present

## 2023-07-31 NOTE — Progress Notes (Signed)
Date:  07/31/2023   Name:  Sheila Gilbert   DOB:  1991/02/19   MRN:  [id number]   Chief Complaint: Establish Care and Weight Loss (Wants to lose weight )  HPI Sheila Gilbert is a pleasant 32 year old female with a history of obesity, asthma, and mild HS new to the practice today to establish care. Had annual GYN exam 10/30/22.   Interested in discussing weight management. Saw a RD twice in June and Aug 2024, f/u anticipated in Nov but not completed due to illness. Patient states she is not sure how much benefit she derived from these visits, as the RD was not offering very specific/personalized advice to her.  Currently low physical activity, though she does walk the dog around the apartment complex twice daily most days.  Husband is a Investment banker, operational, and uses a lot of fats in his cooking (heavy whipping cream, butter, etc.).  He also serves her large portions on the plate and she struggles to address this with him.  States that she does a nice job with portion control when she packs her own lunch for example.  When it comes to pharmacotherapy for weight loss, she is essentially medication nave aside from having used bupropion in 2017 for depression, which she states made her feel "like a zombie" but also had other life factors at the time.  Would like dermatology referral for HS of legs/groin. Says her symptoms are mild but she'd rather they be absent.   Mentions some trouble with focus/attention wandering. Sister has ADHD on Adderall.    Medication list has been reviewed and updated.  Current Meds  Medication Sig   albuterol (VENTOLIN HFA) 108 (90 Base) MCG/ACT inhaler Inhale into the lungs.   busPIRone (BUSPAR) 5 MG tablet Take 5 mg by mouth 2 (two) times daily.   escitalopram (LEXAPRO) 10 MG tablet Take 10 mg by mouth daily.   esomeprazole (NEXIUM) 40 MG capsule Take 1 capsule (40 mg total) by mouth daily.   etonogestrel-ethinyl estradiol (NUVARING) 0.12-0.015 MG/24HR vaginal ring Insert vaginally and  leave in place for 3 consecutive weeks, then remove for 1 week.     Review of Systems  Patient Active Problem List   Diagnosis Date Noted   Class 2 obesity 07/31/2023   Family history of breast cancer 10/30/2022   Family history of ovarian cancer 10/30/2022   Hidradenitis suppurativa 04/09/2016    Allergies  Allergen Reactions   Bee Pollen    Dust Mite Extract    Latex Swelling    Immunization History  Administered Date(s) Administered   Influenza, Seasonal, Injecte, Preservative Fre 07/31/2023   PFIZER(Purple Top)SARS-COV-2 Vaccination 02/09/2020, 03/02/2020    History reviewed. No pertinent surgical history.  Social History   Tobacco Use   Smoking status: Never   Smokeless tobacco: Never  Vaping Use   Vaping status: Never Used  Substance Use Topics   Alcohol use: Not Currently   Drug use: No    Family History  Problem Relation Age of Onset   Hypertension Mother    Diabetes Mother    Hypertension Father    Heart disease Father    Diabetes Father    Breast cancer Maternal Aunt 30   Breast cancer Maternal Grandmother 10   Ovarian cancer Maternal Grandmother         07/31/2023    8:41 AM  GAD 7 : Generalized Anxiety Score  Nervous, Anxious, on Edge 2  Control/stop worrying 1  Worry too much - different things  2  Trouble relaxing 1  Restless 3  Easily annoyed or irritable 2  Afraid - awful might happen 1  Total GAD 7 Score 12  Anxiety Difficulty Somewhat difficult       07/31/2023    8:41 AM 03/06/2023    8:15 AM  Depression screen PHQ 2/9  Decreased Interest 1 0  Down, Depressed, Hopeless 1 0  PHQ - 2 Score 2 0  Altered sleeping 2   Tired, decreased energy 2   Change in appetite 2   Feeling bad or failure about yourself  1   Trouble concentrating 2   Moving slowly or fidgety/restless 2   Suicidal thoughts 0   PHQ-9 Score 13   Difficult doing work/chores Somewhat difficult     BP Readings from Last 3 Encounters:  07/31/23 (!) 140/96   10/30/22 122/88  12/03/21 138/78    Wt Readings from Last 3 Encounters:  07/31/23 284 lb (128.8 kg)  05/08/23 285 lb 8 oz (129.5 kg)  03/06/23 280 lb 14.4 oz (127.4 kg)    BP (!) 140/96 (BP Location: Left Arm, Patient Position: Sitting, Cuff Size: Large)   Pulse 83   Temp 98.2 F (36.8 C) (Oral)   Resp (!) 96   Ht 5\' 10"  (1.778 m)   Wt 284 lb (128.8 kg)   BMI 40.75 kg/m   Physical Exam Vitals and nursing note reviewed.  Constitutional:      Appearance: Normal appearance.  Neck:     Vascular: No carotid bruit.  Cardiovascular:     Rate and Rhythm: Normal rate and regular rhythm.     Heart sounds: No murmur heard.    No friction rub. No gallop.  Pulmonary:     Effort: Pulmonary effort is normal.     Breath sounds: Normal breath sounds.  Abdominal:     General: There is no distension.  Musculoskeletal:        General: Normal range of motion.  Skin:    General: Skin is warm and dry.  Neurological:     Mental Status: She is alert and oriented to person, place, and time.     Gait: Gait is intact.  Psychiatric:        Mood and Affect: Mood and affect normal.     Recent Labs     Component Value Date/Time   NA 140 12/03/2021 1246   K 3.4 (L) 12/03/2021 1246   CL 109 12/03/2021 1246   CO2 22 12/03/2021 1246   GLUCOSE 108 (H) 12/03/2021 1246   BUN 11 12/03/2021 1246   CREATININE 0.62 12/03/2021 1246   CALCIUM 8.8 (L) 12/03/2021 1246   PROT 7.8 10/06/2017 1944   ALBUMIN 4.5 10/06/2017 1944   AST 34 10/06/2017 1944   ALT 40 10/06/2017 1944   ALKPHOS 62 10/06/2017 1944   BILITOT 0.7 10/06/2017 1944   GFRNONAA >60 12/03/2021 1246   GFRAA >60 10/06/2017 1944    Lab Results  Component Value Date   WBC 4.9 12/03/2021   HGB 13.0 12/03/2021   HCT 41.3 12/03/2021   MCV 87.7 12/03/2021   PLT 255 12/03/2021   No results found for: "HGBA1C" No results found for: "CHOL", "HDL", "LDLCALC", "LDLDIRECT", "TRIG", "CHOLHDL" No results found for: "TSH"   Assessment  and Plan:  1. Class 3 obesity Briefly discussed options with the patient today.  Will obtain labs as below to assess for metabolic comorbidities which will guide decisions on pharmacotherapy.  Might also consider future switch from  escitalopram to fluoxetine given relative weight neutrality. - CBC with Differential/Platelet - Comprehensive metabolic panel - TSH - Hemoglobin A1c - Lipid panel  2. Prediabetes Repeat A1c today - Hemoglobin A1c  3. Elevated blood pressure reading in office without diagnosis of hypertension Patient has never been told she has hypertension, nor has she been treated for this problem.  Advised home BP monitoring.  No medication at this time.  Plan for short interval follow-up.  4. Screening for hyperlipidemia Check lipids today - Lipid panel  5. Hidradenitis suppurativa Not directly examined today, mild per GYN.  Refer to dermatology for further management. - Ambulatory referral to Dermatology  6. Need for influenza vaccination Flu shot administered today. - Flu vaccine trivalent PF, 6mos and older(Flulaval,Afluria,Fluarix,Fluzone)   Return in about 6 weeks (around 09/11/2023) for f/u OV weight management.    Today's visit billed for provider time of 50 minutes including chart review from multiple outside sources, lab review, history taking, physical exam, discussion on multiple chronic conditions, lab orders, and discussion on medication options.  Alvester Morin, PA-C, DMSc, Nutritionist [facility] [phone]

## 2023-08-01 LAB — CBC WITH DIFFERENTIAL/PLATELET
Basophils Absolute: 0.1 10*3/uL (ref 0.0–0.2)
Basos: 1 %
EOS (ABSOLUTE): 0.2 10*3/uL (ref 0.0–0.4)
Eos: 2 %
Hematocrit: 40.7 % (ref 34.0–46.6)
Hemoglobin: 12.8 g/dL (ref 11.1–15.9)
Immature Grans (Abs): 0 10*3/uL (ref 0.0–0.1)
Immature Granulocytes: 0 %
Lymphocytes Absolute: 2.5 10*3/uL (ref 0.7–3.1)
Lymphs: 34 %
MCH: 27.9 pg (ref 26.6–33.0)
MCHC: 31.4 g/dL — ABNORMAL LOW (ref 31.5–35.7)
MCV: 89 fL (ref 79–97)
Monocytes Absolute: 0.5 10*3/uL (ref 0.1–0.9)
Monocytes: 7 %
Neutrophils Absolute: 4.2 10*3/uL (ref 1.4–7.0)
Neutrophils: 56 %
Platelets: 312 10*3/uL (ref 150–450)
RBC: 4.58 x10E6/uL (ref 3.77–5.28)
RDW: 13.2 % (ref 11.7–15.4)
WBC: 7.4 10*3/uL (ref 3.4–10.8)

## 2023-08-01 LAB — LIPID PANEL
Chol/HDL Ratio: 4.5 ratio — ABNORMAL HIGH (ref 0.0–4.4)
Cholesterol, Total: 177 mg/dL (ref 100–199)
HDL: 39 mg/dL — ABNORMAL LOW (ref >39)
LDL Chol Calc (NIH): 123 mg/dL — ABNORMAL HIGH (ref 0–99)
Triglycerides: 78 mg/dL (ref 0–149)
VLDL Cholesterol Cal: 15 mg/dL (ref 5–40)

## 2023-08-01 LAB — HEMOGLOBIN A1C
Est. average glucose Bld gHb Est-mCnc: 137 mg/dL
Hgb A1c MFr Bld: 6.4 % — ABNORMAL HIGH (ref 4.8–5.6)

## 2023-08-01 LAB — COMPREHENSIVE METABOLIC PANEL
ALT: 43 [IU]/L — ABNORMAL HIGH (ref 0–32)
AST: 30 [IU]/L (ref 0–40)
Albumin: 4.2 g/dL (ref 3.9–4.9)
Alkaline Phosphatase: 80 [IU]/L (ref 44–121)
BUN/Creatinine Ratio: 13 (ref 9–23)
BUN: 8 mg/dL (ref 6–20)
Bilirubin Total: 0.2 mg/dL (ref 0.0–1.2)
CO2: 22 mmol/L (ref 20–29)
Calcium: 9 mg/dL (ref 8.7–10.2)
Chloride: 107 mmol/L — ABNORMAL HIGH (ref 96–106)
Creatinine, Ser: 0.62 mg/dL (ref 0.57–1.00)
Globulin, Total: 2.5 g/dL (ref 1.5–4.5)
Glucose: 98 mg/dL (ref 70–99)
Potassium: 4.2 mmol/L (ref 3.5–5.2)
Sodium: 143 mmol/L (ref 134–144)
Total Protein: 6.7 g/dL (ref 6.0–8.5)
eGFR: 120 mL/min/{1.73_m2} (ref >59)

## 2023-08-01 LAB — TSH: TSH: 1.5 u[IU]/mL (ref 0.450–4.500)

## 2023-08-03 DIAGNOSIS — E8881 Metabolic syndrome: Secondary | ICD-10-CM | POA: Insufficient documentation

## 2023-08-03 DIAGNOSIS — E785 Hyperlipidemia, unspecified: Secondary | ICD-10-CM | POA: Insufficient documentation

## 2023-08-10 ENCOUNTER — Other Ambulatory Visit: Payer: Self-pay | Admitting: Physician Assistant

## 2023-08-10 ENCOUNTER — Telehealth: Payer: Self-pay | Admitting: Physician Assistant

## 2023-08-10 DIAGNOSIS — E66813 Obesity, class 3: Secondary | ICD-10-CM

## 2023-08-10 MED ORDER — WEGOVY 0.25 MG/0.5ML ~~LOC~~ SOAJ: 0.2500 mg | SUBCUTANEOUS | 0 refills | Status: DC

## 2023-08-10 NOTE — Progress Notes (Signed)
1. Class 3 severe obesity with serious comorbidity and body mass index (BMI) of 40.0 to 44.9 in adult, unspecified obesity type (HCC) Comorbidities of preDM, HLD, metabolic syndrome. BMI >40. Will initiate [employer] pending insurance coverage.  - Semaglutide-Weight Management (WEGOVY) 0.25 MG/0.5ML SOAJ; Inject 0.25 mg into the skin once a week.  Dispense: 2 mL; Refill: 0

## 2023-08-10 NOTE — Telephone Encounter (Signed)
Class 3 obesity with comorbidities of preDM, HLD, metabolic syndrome. BMI >40. Will initiate [employer] pending insurance coverage.

## 2023-08-11 ENCOUNTER — Other Ambulatory Visit: Payer: Self-pay

## 2023-08-11 NOTE — Telephone Encounter (Signed)
PA completed waiting on insurance approval.  Key: [id number]  KP

## 2023-08-14 NOTE — Telephone Encounter (Signed)
Approved. . Authorization Expiration Date: December 15, 2023.  KP

## 2023-09-07 ENCOUNTER — Other Ambulatory Visit: Payer: Self-pay | Admitting: Physician Assistant

## 2023-09-07 DIAGNOSIS — Z6841 Body Mass Index (BMI) 40.0 and over, adult: Secondary | ICD-10-CM

## 2023-09-11 ENCOUNTER — Ambulatory Visit: Payer: BC Managed Care – PPO | Admitting: Physician Assistant

## 2023-09-11 DIAGNOSIS — Z6838 Body mass index (BMI) 38.0-38.9, adult: Secondary | ICD-10-CM | POA: Diagnosis not present

## 2023-09-11 DIAGNOSIS — E66812 Obesity, class 2: Secondary | ICD-10-CM | POA: Diagnosis not present

## 2023-09-11 MED ORDER — WEGOVY 0.25 MG/0.5ML ~~LOC~~ SOAJ: 0.2500 mg | SUBCUTANEOUS | 0 refills | Status: DC

## 2023-09-11 NOTE — Patient Instructions (Signed)
-  It was a pleasure to see you today! Please review your visit summary for helpful information -I would encourage you to follow your care via MyChart where you can access lab results, notes, messages, and more -If you feel that we did a nice job today, please complete your after-visit survey and leave [place] a Google review! Your CMA today was Mariann Barter and your provider was Alvester Morin, PA-C, DMSc -Please return for follow-up in about 4 weeks

## 2023-09-11 NOTE — Progress Notes (Signed)
 Date:  09/11/2023   Name:  Sheila Gilbert   DOB:  1990-09-12   MRN:  [id number]   Chief Complaint: Weight Check (Down 14 pounds /)  HPI Sheila Gilbert returns for f/u after initiating Wegovy  shortly following our last visit, having just completed her first month at the 0.25 mg strength with the last dose 1 week ago.  She is pleased with 14 pound weight loss by our scale and reports minimal side effects aside from mild reflux symptoms with certain foods and 1 episode of vomiting the day after her first dose.  She states she has not experienced much appetite suppression, but more along the lines of better recognition when she is full and greater portion control.  She is doing well with physical activity, reporting cardio in preparation for 5K in May and also strength training at the apartment gym 2-3 times per week.   Medication list has been reviewed and updated.  Current Meds  Medication Sig   albuterol  (VENTOLIN  HFA) 108 (90 Base) MCG/ACT inhaler Inhale into the lungs.   busPIRone (BUSPAR) 5 MG tablet Take 5 mg by mouth 2 (two) times daily.   escitalopram  (LEXAPRO ) 10 MG tablet Take 10 mg by mouth daily.   etonogestrel -ethinyl estradiol  (NUVARING) 0.12-0.015 MG/24HR vaginal ring Insert vaginally and leave in place for 3 consecutive weeks, then remove for 1 week.   [DISCONTINUED] Semaglutide -Weight Management (WEGOVY ) 0.25 MG/0.5ML SOAJ Inject 0.25 mg into the skin once a week.     Review of Systems  Patient Active Problem List   Diagnosis Date Noted   Mild hyperlipidemia 08/03/2023   Metabolic syndrome 08/03/2023   Class 3 severe obesity with serious comorbidity and body mass index (BMI) of 40.0 to 44.9 in adult [facility]) 07/31/2023   Prediabetes 07/31/2023   Family history of breast cancer 10/30/2022   Family history of ovarian cancer 10/30/2022   Hidradenitis suppurativa 04/09/2016    Allergies  Allergen Reactions   Bee Pollen    Dust Mite Extract    Latex Swelling    Immunization  History  Administered Date(s) Administered   Influenza, Seasonal, Injecte, Preservative Fre 07/31/2023   PFIZER(Purple Top)SARS-COV-2 Vaccination 02/09/2020, 03/02/2020    History reviewed. No pertinent surgical history.  Social History   Tobacco Use   Smoking status: Never   Smokeless tobacco: Never  Vaping Use   Vaping status: Never Used  Substance Use Topics   Alcohol use: Not Currently   Drug use: No    Family History  Problem Relation Age of Onset   Hypertension Mother    Diabetes Mother    Hypertension Father    Heart disease Father    Diabetes Father    Breast cancer Maternal Aunt 30   Breast cancer Maternal Grandmother 73   Ovarian cancer Maternal Grandmother         09/11/2023    8:10 AM 07/31/2023    8:41 AM  GAD 7 : Generalized Anxiety Score  Nervous, Anxious, on Edge 3 2  Control/stop worrying 3 1  Worry too much - different things 3 2  Trouble relaxing 2 1  Restless 3 3  Easily annoyed or irritable 2 2  Afraid - awful might happen 0 1  Total GAD 7 Score 16 12  Anxiety Difficulty Somewhat difficult Somewhat difficult       09/11/2023    8:09 AM 07/31/2023    8:41 AM 03/06/2023    8:15 AM  Depression screen PHQ 2/9  Decreased Interest 1  1 0  Down, Depressed, Hopeless 1 1 0  PHQ - 2 Score 2 2 0  Altered sleeping 2 2   Tired, decreased energy 2 2   Change in appetite 0 2   Feeling bad or failure about yourself  1 1   Trouble concentrating 2 2   Moving slowly or fidgety/restless 3 2   Suicidal thoughts 0 0   PHQ-9 Score 12 13   Difficult doing work/chores  Somewhat difficult     BP Readings from Last 3 Encounters:  09/11/23 124/86  07/31/23 (!) 140/96  10/30/22 122/88    Wt Readings from Last 3 Encounters:  09/11/23 270 lb (122.5 kg)  07/31/23 284 lb (128.8 kg)  05/08/23 285 lb 8 oz (129.5 kg)    BP 124/86 (BP Location: Left Arm, Patient Position: Sitting, Cuff Size: Large)   Pulse 80   Temp 98 F (36.7 C) (Oral)   Ht 5' 10  (1.778 m)   Wt 270 lb (122.5 kg)   SpO2 96%   BMI 38.74 kg/m   Physical Exam Vitals and nursing note reviewed.  Constitutional:      Appearance: Normal appearance.  Cardiovascular:     Rate and Rhythm: Normal rate.  Pulmonary:     Effort: Pulmonary effort is normal.  Abdominal:     General: There is no distension.  Musculoskeletal:        General: Normal range of motion.  Skin:    General: Skin is warm and dry.  Neurological:     Mental Status: She is alert and oriented to person, place, and time.     Gait: Gait is intact.  Psychiatric:        Mood and Affect: Mood and affect normal.     Recent Labs     Component Value Date/Time   NA 143 07/31/2023 1031   K 4.2 07/31/2023 1031   CL 107 (H) 07/31/2023 1031   CO2 22 07/31/2023 1031   GLUCOSE 98 07/31/2023 1031   GLUCOSE 108 (H) 12/03/2021 1246   BUN 8 07/31/2023 1031   CREATININE 0.62 07/31/2023 1031   CALCIUM 9.0 07/31/2023 1031   PROT 6.7 07/31/2023 1031   ALBUMIN 4.2 07/31/2023 1031   AST 30 07/31/2023 1031   ALT 43 (H) 07/31/2023 1031   ALKPHOS 80 07/31/2023 1031   BILITOT 0.2 07/31/2023 1031   GFRNONAA >60 12/03/2021 1246   GFRAA >60 10/06/2017 1944    Lab Results  Component Value Date   WBC 7.4 07/31/2023   HGB 12.8 07/31/2023   HCT 40.7 07/31/2023   MCV 89 07/31/2023   PLT 312 07/31/2023   Lab Results  Component Value Date   HGBA1C 6.4 (H) 07/31/2023   Lab Results  Component Value Date   CHOL 177 07/31/2023   HDL 39 (L) 07/31/2023   LDLCALC 123 (H) 07/31/2023   TRIG 78 07/31/2023   CHOLHDL 4.5 (H) 07/31/2023   Lab Results  Component Value Date   TSH 1.500 07/31/2023     Assessment and Plan:  1. Class 2 severe obesity with serious comorbidity and body mass index (BMI) of 38.0 to 38.9 in adult, unspecified obesity type (HCC) Congratulated on very impressive progress so far.  She seems motivated to adhere to lifestyle changes, reemphasized the importance of strength training and protein  intake.  She verbalizes understanding.  Discussed option to stay at the 0.25 mg dose or increase to 0.50 mg.  Through shared decision making, considering her loss of  14 pounds in 1 month, we will continue with the current dose and reconsider titration at her next visit.  - Semaglutide -Weight Management (WEGOVY ) 0.25 MG/0.5ML SOAJ; Inject 0.25 mg into the skin once a week.  Dispense: 2 mL; Refill: 0   Return in about 4 weeks (around 10/09/2023) for OV f/u weight mgmt.    Rolan Hoyle, PA-C, DMSc, Nutritionist [facility] Primary Care and Sports Medicine [facility] [phone]

## 2023-10-09 ENCOUNTER — Ambulatory Visit: Payer: BC Managed Care – PPO | Admitting: Physician Assistant

## 2023-10-09 ENCOUNTER — Encounter: Payer: Self-pay | Admitting: Physician Assistant

## 2023-10-09 VITALS — BP 116/82 | HR 95 | Temp 98.1°F | Ht 70.0 in | Wt 265.0 lb

## 2023-10-09 DIAGNOSIS — K5903 Drug induced constipation: Secondary | ICD-10-CM | POA: Diagnosis not present

## 2023-10-09 DIAGNOSIS — Z6838 Body mass index (BMI) 38.0-38.9, adult: Secondary | ICD-10-CM

## 2023-10-09 DIAGNOSIS — E66812 Obesity, class 2: Secondary | ICD-10-CM

## 2023-10-09 MED ORDER — WEGOVY 0.25 MG/0.5ML ~~LOC~~ SOAJ: 0.2500 mg | SUBCUTANEOUS | 0 refills | Status: DC

## 2023-10-09 NOTE — Progress Notes (Signed)
Date:  10/09/2023   Name:  Sheila Gilbert   DOB:  30-Oct-1990   MRN:  [id number]   Chief Complaint: Weight Check (Down 5 lbs)  HPI Sheila Gilbert returns for 1 month follow-up on weight management having finished her second month of Wegovy at the 0.25 mg dose.  She has lost an additional 5 pounds since last visit, resulting in a total loss of 19 pounds in just over 2 months. 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   Reports continued efficacy of the medication when it comes to portion control.  Typically targets 50% of her plate to be vegetables, 25% protein, 25% carbs.  She remains active daily, and is getting 2 strength training sessions at the gym each week.   She has not had BM in 6 days, thinks it might be related to the recent dietary changes with the aforementioned work dinners.  Medication list has been reviewed and updated.  Current Meds  Medication Sig   albuterol (VENTOLIN HFA) 108 (90 Base) MCG/ACT inhaler Inhale into the lungs.   busPIRone (BUSPAR) 5 MG tablet Take 5 mg by mouth 2 (two) times daily.   etonogestrel-ethinyl estradiol (NUVARING) 0.12-0.015 MG/24HR vaginal ring Insert vaginally and leave in place for 3 consecutive weeks, then remove for 1 week.   [DISCONTINUED] escitalopram (LEXAPRO) 10 MG tablet Take 10 mg by mouth daily.   [DISCONTINUED] Semaglutide-Weight Management (WEGOVY) 0.25 MG/0.5ML SOAJ Inject 0.25 mg into the skin once a week.     Review of Systems  Patient Active Problem List   Diagnosis Date Noted   Mild hyperlipidemia 08/03/2023   Metabolic syndrome 08/03/2023   Class 3 severe obesity with serious comorbidity and body mass index (BMI) of 40.0 to 44.9 in adult [facility]) 07/31/2023   Prediabetes 07/31/2023   Family history of breast cancer 10/30/2022   Family history of ovarian cancer 10/30/2022   Hidradenitis suppurativa 04/09/2016    Allergies  Allergen Reactions   Bee  Pollen    Dust Mite Extract    Latex Swelling    Immunization History  Administered Date(s) Administered   Influenza, Seasonal, Injecte, Preservative Fre 07/31/2023   PFIZER(Purple Top)SARS-COV-2 Vaccination 02/09/2020, 03/02/2020    History reviewed. No pertinent surgical history.  Social History   Tobacco Use   Smoking status: Never   Smokeless tobacco: Never  Vaping Use   Vaping status: Never Used  Substance Use Topics   Alcohol use: Not Currently   Drug use: No    Family History  Problem Relation Age of Onset   Hypertension Mother    Diabetes Mother    Hypertension Father    Heart disease Father    Diabetes Father    Breast cancer Maternal Aunt 30   Breast cancer Maternal Grandmother 50   Ovarian cancer Maternal Grandmother         10/09/2023    8:29 AM 09/11/2023    8:10 AM 07/31/2023    8:41 AM  GAD 7 : Generalized Anxiety Score  Nervous, Anxious, on Edge 3 3 2   Control/stop worrying 2 3 1   Worry too much - different things 3 3 2   Trouble relaxing 2 2 1   Restless 2 3 3   Easily annoyed or irritable 1 2 2   Afraid - awful might happen 2 0 1  Total GAD 7 Score 15 16 12   Anxiety Difficulty Somewhat difficult Somewhat difficult Somewhat difficult  10/09/2023    8:29 AM 09/11/2023    8:09 AM 07/31/2023    8:41 AM  Depression screen PHQ 2/9  Decreased Interest 2 1 1   Down, Depressed, Hopeless 1 1 1   PHQ - 2 Score 3 2 2   Altered sleeping 3 2 2   Tired, decreased energy 3 2 2   Change in appetite 0 0 2  Feeling bad or failure about yourself  1 1 1   Trouble concentrating 2 2 2   Moving slowly or fidgety/restless 2 3 2   Suicidal thoughts 0 0 0  PHQ-9 Score 14 12 13   Difficult doing work/chores Not difficult at all  Somewhat difficult    BP Readings from Last 3 Encounters:  10/09/23 116/82  09/11/23 124/86  07/31/23 (!) 140/96    Wt Readings from Last 3 Encounters:  10/09/23 265 lb (120.2 kg)  09/11/23 270 lb (122.5 kg)  07/31/23 284 lb (128.8  kg)    BP 116/82   Pulse 95   Temp 98.1 F (36.7 C)   Ht 5\' 10"  (1.778 m)   Wt 265 lb (120.2 kg)   SpO2 96%   BMI 38.02 kg/m   Physical Exam Vitals and nursing note reviewed.  Constitutional:      Appearance: Normal appearance.  Cardiovascular:     Rate and Rhythm: Normal rate.  Pulmonary:     Effort: Pulmonary effort is normal.  Abdominal:     General: There is no distension.  Musculoskeletal:        General: Normal range of motion.  Skin:    General: Skin is warm and dry.  Neurological:     Mental Status: She is alert and oriented to person, place, and time.     Gait: Gait is intact.  Psychiatric:        Mood and Affect: Mood and affect normal.     Recent Labs     Component Value Date/Time   NA 143 07/31/2023 1031   K 4.2 07/31/2023 1031   CL 107 (H) 07/31/2023 1031   CO2 22 07/31/2023 1031   GLUCOSE 98 07/31/2023 1031   GLUCOSE 108 (H) 12/03/2021 1246   BUN 8 07/31/2023 1031   CREATININE 0.62 07/31/2023 1031   CALCIUM 9.0 07/31/2023 1031   PROT 6.7 07/31/2023 1031   ALBUMIN 4.2 07/31/2023 1031   AST 30 07/31/2023 1031   ALT 43 (H) 07/31/2023 1031   ALKPHOS 80 07/31/2023 1031   BILITOT 0.2 07/31/2023 1031   GFRNONAA >60 12/03/2021 1246   GFRAA >60 10/06/2017 1944    Lab Results  Component Value Date   WBC 7.4 07/31/2023   HGB 12.8 07/31/2023   HCT 40.7 07/31/2023   MCV 89 07/31/2023   PLT 312 07/31/2023   Lab Results  Component Value Date   HGBA1C 6.4 (H) 07/31/2023   Lab Results  Component Value Date   CHOL 177 07/31/2023   HDL 39 (L) 07/31/2023   LDLCALC 123 (H) 07/31/2023   TRIG 78 07/31/2023   CHOLHDL 4.5 (H) 07/31/2023   Lab Results  Component Value Date   TSH 1.500 07/31/2023     Assessment and Plan:  1. Class 2 severe obesity with serious comorbidity and body mass index (BMI) of 38.0 to 38.9 in adult, unspecified obesity type (HCC) (Primary) Again discussed keeping the same dose versus dose increase at this visit.  However,  considering the continued progress with weight loss, subjective efficacy, and likely side effect of constipation, we will remain at the current  dose for now.  Patient is in agreement with plan.  Continue adequate protein intake and strength training.  - Semaglutide-Weight Management (WEGOVY) 0.25 MG/0.5ML SOAJ; Inject 0.25 mg into the skin once a week.  Dispense: 2 mL; Refill: 0  2. Drug-induced constipation Encouraged patient to purchase OTC MiraLAX and hydrate well.  If that does not work, try magnesium citrate.  Goal for BM is at least twice per week, preferably more.   Return in about 4 weeks (around 11/06/2023) for OV f/u weight mgmt.    Alvester Morin, PA-C, DMSc, Nutritionist [facility] Primary Care and Sports Medicine MedCenter [facility] [phone]

## 2023-11-06 ENCOUNTER — Ambulatory Visit: Payer: BC Managed Care – PPO | Admitting: Physician Assistant

## 2023-11-06 ENCOUNTER — Encounter: Payer: Self-pay | Admitting: Physician Assistant

## 2023-11-06 VITALS — BP 114/78 | HR 87 | Temp 98.2°F | Ht 70.0 in | Wt 258.0 lb

## 2023-11-06 DIAGNOSIS — R7303 Prediabetes: Secondary | ICD-10-CM

## 2023-11-06 DIAGNOSIS — Z6837 Body mass index (BMI) 37.0-37.9, adult: Secondary | ICD-10-CM | POA: Diagnosis not present

## 2023-11-06 DIAGNOSIS — E785 Hyperlipidemia, unspecified: Secondary | ICD-10-CM | POA: Diagnosis not present

## 2023-11-06 DIAGNOSIS — E66812 Obesity, class 2: Secondary | ICD-10-CM

## 2023-11-06 DIAGNOSIS — R7989 Other specified abnormal findings of blood chemistry: Secondary | ICD-10-CM

## 2023-11-06 MED ORDER — WEGOVY 0.5 MG/0.5ML ~~LOC~~ SOAJ: 0.5000 mg | SUBCUTANEOUS | 1 refills | Status: DC

## 2023-11-06 NOTE — Progress Notes (Signed)
 Date:  11/06/2023   Name:  Sheila Gilbert   DOB:  10/23/1990   MRN:  [id number]   Chief Complaint: Weight Check (Down 7 lbs /)  HPI Desira presents today for 1 month follow-up on weight management, currently on the Wegovy 0.25 mg weekly dose which she has now been taking for 3 months, continues to do well with this but thinks the effects are waning.  She is down an additional 7 pounds from last visit bringing her total weight lost nearly 30 pounds.  She admits to possible complicating factor with recent personal stressors/relationship problems.  She hopes to establish with a personal therapist and also a couples therapist.   Medication list has been reviewed and updated.  Current Meds  Medication Sig   albuterol (VENTOLIN HFA) 108 (90 Base) MCG/ACT inhaler Inhale into the lungs.   busPIRone (BUSPAR) 5 MG tablet Take 5 mg by mouth 2 (two) times daily.   etonogestrel-ethinyl estradiol (NUVARING) 0.12-0.015 MG/24HR vaginal ring Insert vaginally and leave in place for 3 consecutive weeks, then remove for 1 week.   Semaglutide-Weight Management (WEGOVY) 0.5 MG/0.5ML SOAJ Inject 0.5 mg into the skin once a week.   [DISCONTINUED] Semaglutide-Weight Management (WEGOVY) 0.25 MG/0.5ML SOAJ Inject 0.25 mg into the skin once a week.     Review of Systems  Patient Active Problem List   Diagnosis Date Noted   Mild hyperlipidemia 08/03/2023   Metabolic syndrome 08/03/2023   Class 3 severe obesity with serious comorbidity and body mass index (BMI) of 40.0 to 44.9 in adult [facility]) 07/31/2023   Prediabetes 07/31/2023   Family history of breast cancer 10/30/2022   Family history of ovarian cancer 10/30/2022   Hidradenitis suppurativa 04/09/2016    Allergies  Allergen Reactions   Bee Pollen    Dust Mite Extract    Latex Swelling    Immunization History  Administered Date(s) Administered   Influenza, Seasonal, Injecte, Preservative Fre 07/31/2023   PFIZER(Purple Top)SARS-COV-2 Vaccination  02/09/2020, 03/02/2020    History reviewed. No pertinent surgical history.  Social History   Tobacco Use   Smoking status: Never   Smokeless tobacco: Never  Vaping Use   Vaping status: Never Used  Substance Use Topics   Alcohol use: Not Currently   Drug use: No    Family History  Problem Relation Age of Onset   Hypertension Mother    Diabetes Mother    Hypertension Father    Heart disease Father    Diabetes Father    Breast cancer Maternal Aunt 30   Breast cancer Maternal Grandmother 50   Ovarian cancer Maternal Grandmother         10/09/2023    8:29 AM 09/11/2023    8:10 AM 07/31/2023    8:41 AM  GAD 7 : Generalized Anxiety Score  Nervous, Anxious, on Edge 3 3 2   Control/stop worrying 2 3 1   Worry too much - different things 3 3 2   Trouble relaxing 2 2 1   Restless 2 3 3   Easily annoyed or irritable 1 2 2   Afraid - awful might happen 2 0 1  Total GAD 7 Score 15 16 12   Anxiety Difficulty Somewhat difficult Somewhat difficult Somewhat difficult       10/09/2023    8:29 AM 09/11/2023    8:09 AM 07/31/2023    8:41 AM  Depression screen PHQ 2/9  Decreased Interest 2 1 1   Down, Depressed, Hopeless 1 1 1   PHQ - 2 Score 3 2  2  Altered sleeping 3 2 2   Tired, decreased energy 3 2 2   Change in appetite 0 0 2  Feeling bad or failure about yourself  1 1 1   Trouble concentrating 2 2 2   Moving slowly or fidgety/restless 2 3 2   Suicidal thoughts 0 0 0  PHQ-9 Score 14 12 13   Difficult doing work/chores Not difficult at all  Somewhat difficult    BP Readings from Last 3 Encounters:  11/06/23 114/78  10/09/23 116/82  09/11/23 124/86    Wt Readings from Last 3 Encounters:  11/06/23 258 lb (117 kg)  10/09/23 265 lb (120.2 kg)  09/11/23 270 lb (122.5 kg)    BP 114/78   Pulse 87   Temp 98.2 F (36.8 C)   Ht 5\' 10"  (1.778 m)   Wt 258 lb (117 kg)   SpO2 96%   BMI 37.02 kg/m   Physical Exam Vitals and nursing note reviewed.  Constitutional:      Appearance:  Normal appearance.  Cardiovascular:     Rate and Rhythm: Normal rate and regular rhythm.     Heart sounds: No murmur heard.    No friction rub. No gallop.  Pulmonary:     Effort: Pulmonary effort is normal.     Breath sounds: Normal breath sounds.  Abdominal:     General: There is no distension.  Musculoskeletal:        General: Normal range of motion.  Skin:    General: Skin is warm and dry.  Neurological:     Mental Status: She is alert and oriented to person, place, and time.     Gait: Gait is intact.  Psychiatric:        Mood and Affect: Mood and affect normal.     Recent Labs     Component Value Date/Time   NA 143 07/31/2023 1031   K 4.2 07/31/2023 1031   CL 107 (H) 07/31/2023 1031   CO2 22 07/31/2023 1031   GLUCOSE 98 07/31/2023 1031   GLUCOSE 108 (H) 12/03/2021 1246   BUN 8 07/31/2023 1031   CREATININE 0.62 07/31/2023 1031   CALCIUM 9.0 07/31/2023 1031   PROT 6.7 07/31/2023 1031   ALBUMIN 4.2 07/31/2023 1031   AST 30 07/31/2023 1031   ALT 43 (H) 07/31/2023 1031   ALKPHOS 80 07/31/2023 1031   BILITOT 0.2 07/31/2023 1031   GFRNONAA >60 12/03/2021 1246   GFRAA >60 10/06/2017 1944    Lab Results  Component Value Date   WBC 7.4 07/31/2023   HGB 12.8 07/31/2023   HCT 40.7 07/31/2023   MCV 89 07/31/2023   PLT 312 07/31/2023   Lab Results  Component Value Date   HGBA1C 6.4 (H) 07/31/2023   Lab Results  Component Value Date   CHOL 177 07/31/2023   HDL 39 (L) 07/31/2023   LDLCALC 123 (H) 07/31/2023   TRIG 78 07/31/2023   CHOLHDL 4.5 (H) 07/31/2023   Lab Results  Component Value Date   TSH 1.500 07/31/2023     Assessment and Plan:  1. Class 2 severe obesity with serious comorbidity and body mass index (BMI) of 37.0 to 37.9 in adult, unspecified obesity type (HCC) (Primary) Continues to do well with [facility].  Through shared decision making, we have decided to increase the dose at this visit.  Patient would like to continue with monthly follow-ups.     Advised the importance of adequate protein intake on this medication as well as resistance training which will  not only improve insulin sensitivity but also build muscle mass and reduce risk of atrophy.  Advised importance of portion control and slower eating on this medication to minimize GI side effects. Avoid high fat foods as these may cause discomfort. Patient aware of potential for weight regain when eventually stopping the medication. For this reason, continued efforts toward improving lifestyle will be particularly important moving forward.   - Hemoglobin A1c - Lipid panel - Hepatic function panel - Semaglutide-Weight Management (WEGOVY) 0.5 MG/0.5ML SOAJ; Inject 0.5 mg into the skin once a week.  Dispense: 2 mL; Refill: 1  2. Elevated LFTs Mildly elevated LFTs last time, repeat today. - Hepatic function panel  3. Prediabetes - Hemoglobin A1c  4. Mild hyperlipidemia - Lipid panel    Due to patient's recent relationship concerns, I have recommended she find a local therapist/counselor and let me know if she needs a referral.  I have sent a link to the [employer] "find a therapist" tool.   Return in about 4 weeks (around 12/04/2023) for OV f/u Wegovy.    Alvester Morin, PA-C, DMSc, Nutritionist [facility] Primary Care and Sports Medicine MedCenter [facility] [phone]

## 2023-11-07 LAB — HEMOGLOBIN A1C
Est. average glucose Bld gHb Est-mCnc: 123 mg/dL
Hgb A1c MFr Bld: 5.9 % — ABNORMAL HIGH (ref 4.8–5.6)

## 2023-11-07 LAB — LIPID PANEL
Chol/HDL Ratio: 5.2 ratio — ABNORMAL HIGH (ref 0.0–4.4)
Cholesterol, Total: 198 mg/dL (ref 100–199)
HDL: 38 mg/dL — ABNORMAL LOW (ref >39)
LDL Chol Calc (NIH): 134 mg/dL — ABNORMAL HIGH (ref 0–99)
Triglycerides: 146 mg/dL (ref 0–149)
VLDL Cholesterol Cal: 26 mg/dL (ref 5–40)

## 2023-11-07 LAB — HEPATIC FUNCTION PANEL
ALT: 67 IU/L — ABNORMAL HIGH (ref 0–32)
AST: 32 IU/L (ref 0–40)
Albumin: 4.6 g/dL (ref 3.9–4.9)
Alkaline Phosphatase: 73 IU/L (ref 44–121)
Bilirubin Total: 0.3 mg/dL (ref 0.0–1.2)
Bilirubin, Direct: 0.12 mg/dL (ref 0.00–0.40)
Total Protein: 7.1 g/dL (ref 6.0–8.5)

## 2023-11-12 ENCOUNTER — Ambulatory Visit: Payer: BC Managed Care – PPO | Admitting: Dermatology

## 2023-11-12 ENCOUNTER — Encounter: Payer: Self-pay | Admitting: Dermatology

## 2023-11-12 DIAGNOSIS — L821 Other seborrheic keratosis: Secondary | ICD-10-CM | POA: Diagnosis not present

## 2023-11-12 DIAGNOSIS — L732 Hidradenitis suppurativa: Secondary | ICD-10-CM | POA: Diagnosis not present

## 2023-11-12 MED ORDER — DOXYCYCLINE HYCLATE 100 MG PO TABS: 100.0000 mg | ORAL_TABLET | Freq: Two times a day (BID) | ORAL | 5 refills | Status: AC

## 2023-11-12 MED ORDER — CLINDAMYCIN PHOSPHATE 1 % EX LOTN: TOPICAL_LOTION | Freq: Two times a day (BID) | CUTANEOUS | 2 refills | Status: DC

## 2023-11-12 NOTE — Patient Instructions (Addendum)

## 2023-11-12 NOTE — Progress Notes (Signed)
 New Patient Visit   Subjective  Sheila Gilbert is a 33 y.o. female who presents for the following: pt was referred by her PCP for HS, mainly flares at inner thighs, has been dealing with this for 4-5 years. Pt reports she used to flare at bilateral underarms years ago. Pt reports alcohol triggers her flares.   Pt unsure of any hx family skin cancers.   The patient has spots, moles and lesions to be evaluated, some may be new or changing and the patient may have concern these could be cancer.   The following portions of the chart were reviewed this encounter and updated as appropriate: medications, allergies, medical history  Review of Systems:  No other skin or systemic complaints except as noted in HPI or Assessment and Plan.  Objective  Well appearing patient in no apparent distress; mood and affect are within normal limits.  A focused examination was performed of the following areas: Bil inner thighs, bil underarms, face  Relevant exam findings are noted in the Assessment and Plan.    Assessment & Plan   HIDRADENITIS SUPPURATIVA, [place] II Exam: L axillary hypertrophic scar, inflammatory nodules bilateral medial thighs   Chronic and persistent condition with duration or expected duration over one year. Condition is bothersome/symptomatic for patient. Currently flared.   Hidradenitis Suppurativa is a chronic; persistent; non-curable, but treatable condition due to abnormal inflamed sweat glands in the body folds (axilla, inframammary, groin, medial thighs), causing recurrent painful draining cysts and scarring. It can be associated with severe scarring acne and cysts; also abscesses and scarring of scalp. The goal is control and prevention of flares, as it is not curable. Scars are permanent and can be thickened. Treatment may include daily use of topical medication and oral antibiotics.  Oral isotretinoin may also be helpful.  For some cases, Humira or Cosentyx (biologic  injections) may be prescribed to decrease the inflammatory process and prevent flares.  When indicated, inflamed cysts may also be treated surgically.  Treatment Plan: Recommend Hibiclens OTC wash daily in shower to reduce bacteria being a trigger. Use when showering and apply to area where most active like inner thighs or underarms. Avoid applying to face.  Use Clindamycin 1% lotion BID prn to reduce staph bacteria in skin.    Take Doxycycline 100mg  BID prn flares, 14 days, 5RF.  Doxycycline should be taken with food to prevent nausea. Avoid mixing with dairy. Do not lay down for 30 minutes after taking. Be cautious with sun exposure and use good sun protection while on this medication. Pregnant women should not take this medication.    Discussed Humira, Cosentyx biologics if topicals and orals not responding to flares.   Reviewed risks of biologics including immunosuppression, infections, injection site reaction, and failure to improve condition. Goal is control of skin condition, not cure.  Some older biologics such as Humira and Enbrel may slightly increase risk of malignancy and may worsen congestive heart failure.  Taltz and Cosentyx may cause inflammatory bowel disease to flare. The use of biologics requires long term medication management, including periodic office visits and monitoring of blood work.    SEBORRHEIC KERATOSIS - Stuck-on, waxy, tan-brown papules and/or plaques at face. - Benign-appearing - Discussed benign etiology and prognosis. - Observe - Call for any changes and can treat with LN2 if getting irritated.   HIDRADENITIS SUPPURATIVA   SEBORRHEIC KERATOSES    Return for 2-3 months w/ Dr. Katrinka Blazing, HS,.  I, Soundra Pilon, CMA, am acting as  scribe for Elie Goody, MD .   Documentation: I have reviewed the above documentation for accuracy and completeness, and I agree with the above.  Elie Goody, MD

## 2023-11-16 NOTE — Progress Notes (Unsigned)
 PCP:  Remo Lipps, PA   No chief complaint on file.    HPI:      [name] is a 33 y.o. G1P1 whose LMP was No LMP recorded. Patient has had an implant., presents today for her annual examination.  Her menses are regular every 28-30 days, lasting 5 days.  No BTB, mild dysmen, no meds needed.  Sex activity: single partner, contraception - NuvaRing vaginal inserts. No pain/bleeding. Last Pap: 10/04/19 Results were: no abnormalities /neg HPV DNA   Having issues with cystic acne on inner thighs/ mons for about 2 yrs. Has tried changing soaps, clothes without relief.   There is a FH of breast cancer in her mat aunt and MGM. There is a FH of ovarian cancer in her MGM. Pt unsure if genetic testing done by affected family members. Pt hasn't had testing done. The patient does do self-breast exams.  Tobacco use: The patient denies current or previous tobacco use. Alcohol use: none No drug use.  Exercise: min active  She does get adequate calcium but not Vitamin D in her diet. Labs with PCP  Patient Active Problem List   Diagnosis Date Noted   Mild hyperlipidemia 08/03/2023   Metabolic syndrome 08/03/2023   Class 3 severe obesity with serious comorbidity and body mass index (BMI) of 40.0 to 44.9 in adult [facility]) 07/31/2023   Prediabetes 07/31/2023   Family history of breast cancer 10/30/2022   Family history of ovarian cancer 10/30/2022   Hidradenitis suppurativa 04/09/2016    No past surgical history on file.  Family History  Problem Relation Age of Onset   Hypertension Mother    Diabetes Mother    Hypertension Father    Heart disease Father    Diabetes Father    Breast cancer Maternal Aunt 30   Breast cancer Maternal Grandmother 37   Ovarian cancer Maternal Grandmother     Social History   Socioeconomic History   Marital status: Single    Spouse name: Not on file   Number of children: 1   Years of education: Not on file   Highest education level:  Bachelor's degree (e.g., BA, AB, BS)  Occupational History   Not on file  Tobacco Use   Smoking status: Never   Smokeless tobacco: Never  Vaping Use   Vaping status: Never Used  Substance and Sexual Activity   Alcohol use: Not Currently   Drug use: No   Sexual activity: Yes    Birth control/protection: None, Inserts  Other Topics Concern   Not on file  Social History Narrative   Not on file   Social Drivers of Health   Financial Resource Strain: Low Risk  (09/11/2023)   Overall Financial Resource Strain (CARDIA)    Difficulty of Paying Living Expenses: Not very hard  Food Insecurity: No Food Insecurity (09/11/2023)   Hunger Vital Sign    Worried About Running Out of Food in the Last Year: Never true    Ran Out of Food in the Last Year: Never true  Transportation Needs: No Transportation Needs (09/11/2023)   PRAPARE - Administrator, Civil Service (Medical): No    Lack of Transportation (Non-Medical): No  Physical Activity: Unknown (09/11/2023)   Exercise Vital Sign    Days of Exercise per Week: 0 days    Minutes of Exercise per Session: Not on file  Stress: Stress Concern Present (09/11/2023)   [employer] of Occupational Health - Occupational Stress Questionnaire  Feeling of Stress : To some extent  Social Connections: Moderately Isolated (09/11/2023)   Social Connection and Isolation Panel [NHANES]    Frequency of Communication with Friends and Family: More than three times a week    Frequency of Social Gatherings with Friends and Family: Twice a week    Attends Religious Services: Never    Database administrator or Organizations: No    Attends Engineer, structural: Not on file    Marital Status: Married  Catering manager Violence: Not At Risk (10/09/2023)   Humiliation, Afraid, Rape, and Kick questionnaire    Fear of Current or Ex-Partner: No    Emotionally Abused: No    Physically Abused: No    Sexually Abused: No     Current Outpatient  Medications:    albuterol (VENTOLIN HFA) 108 (90 Base) MCG/ACT inhaler, Inhale into the lungs., Disp: , Rfl:    busPIRone (BUSPAR) 5 MG tablet, Take 5 mg by mouth 2 (two) times daily., Disp: , Rfl:    clindamycin (CLEOCIN-T) 1 % lotion, Apply topically 2 (two) times daily. Apply topically BID prn, Disp: 60 mL, Rfl: 2   doxycycline (VIBRA-TABS) 100 MG tablet, Take 1 tablet (100 mg total) by mouth 2 (two) times daily for 14 days. Take 1 tablet (100 mg total) by mouth 2 (two) times daily prn flares, Disp: 28 tablet, Rfl: 5   etonogestrel-ethinyl estradiol (NUVARING) 0.12-0.015 MG/24HR vaginal ring, Insert vaginally and leave in place for 3 consecutive weeks, then remove for 1 week., Disp: 3 each, Rfl: 3   Semaglutide-Weight Management (WEGOVY) 0.5 MG/0.5ML SOAJ, Inject 0.5 mg into the skin once a week., Disp: 2 mL, Rfl: 1     ROS:  Review of Systems  Constitutional:  Negative for fatigue, fever and unexpected weight change.  Respiratory:  Negative for cough, shortness of breath and wheezing.   Cardiovascular:  Negative for chest pain, palpitations and leg swelling.  Gastrointestinal:  Negative for blood in stool, constipation, diarrhea, nausea and vomiting.  Endocrine: Negative for cold intolerance, heat intolerance and polyuria.  Genitourinary:  Negative for dyspareunia, dysuria, flank pain, frequency, genital sores, hematuria, menstrual problem, pelvic pain, urgency, vaginal bleeding, vaginal discharge and vaginal pain.  Musculoskeletal:  Negative for back pain, joint swelling and myalgias.  Skin:  Positive for wound. Negative for rash.  Neurological:  Negative for dizziness, syncope, light-headedness, numbness and headaches.  Hematological:  Negative for adenopathy.  Psychiatric/Behavioral:  Negative for agitation, confusion, sleep disturbance and suicidal ideas. The patient is not nervous/anxious.    BREAST: No symptoms   Objective: There were no vitals taken for this  visit.   Physical Exam Constitutional:      Appearance: She is well-developed.  Genitourinary:     Vulva normal.     Right Labia: No rash, tenderness or lesions.    Left Labia: No tenderness, lesions or rash.    No vaginal discharge, erythema or tenderness.      Right Adnexa: not tender and no mass present.    Left Adnexa: not tender and no mass present.    No cervical friability or polyp.     Uterus is not enlarged or tender.  Breasts:    Right: No mass, nipple discharge, skin change or tenderness.     Left: No mass, nipple discharge, skin change or tenderness.  Neck:     Thyroid: No thyromegaly.  Cardiovascular:     Rate and Rhythm: Normal rate and regular rhythm.  Heart sounds: Normal heart sounds. No murmur heard. Pulmonary:     Effort: Pulmonary effort is normal.     Breath sounds: Normal breath sounds.  Abdominal:     Palpations: Abdomen is soft.     Tenderness: There is no abdominal tenderness. There is no guarding or rebound.  Musculoskeletal:        General: Normal range of motion.     Cervical back: Normal range of motion.  Lymphadenopathy:     Cervical: No cervical adenopathy.  Neurological:     General: No focal deficit present.     Mental Status: She is alert and oriented to person, place, and time.     Cranial Nerves: No cranial nerve deficit.  Skin:    General: Skin is warm and dry.  Psychiatric:        Mood and Affect: Mood normal.        Behavior: Behavior normal.        Thought Content: Thought content normal.        Judgment: Judgment normal.  Vitals reviewed.     Assessment/Plan: Encounter for annual routine gynecological examination  Encounter for surveillance of vaginal ring hormonal contraceptive device - Plan: etonogestrel-ethinyl estradiol (NUVARING) 0.12-0.015 MG/24HR vaginal ring; Rx RF  Family history of breast cancer--MyRisk testing discussed and handout given. Pt to consider and f/u prn.   Family history of ovarian  cancer  Hidradenitis suppurativa--mild case inner thigh/mons. Dial soap/acne salicylic pads; loose underwear when at home; warm compresses prn lesions. F/u prn.   No orders of the defined types were placed in this encounter.            GYN counsel adequate intake of calcium and vitamin D, diet and exercise     F/U  No follow-ups on file.  Rainelle Sulewski B. Alizae Bechtel, PA-C 11/16/2023 12:46 PM

## 2023-11-17 ENCOUNTER — Encounter: Payer: Self-pay | Admitting: Obstetrics and Gynecology

## 2023-11-17 ENCOUNTER — Ambulatory Visit (INDEPENDENT_AMBULATORY_CARE_PROVIDER_SITE_OTHER): Payer: BC Managed Care – PPO | Admitting: Obstetrics and Gynecology

## 2023-11-17 ENCOUNTER — Other Ambulatory Visit (HOSPITAL_COMMUNITY)
Admission: RE | Admit: 2023-11-17 | Discharge: 2023-11-17 | Disposition: A | Discharge status: Home / Self Care | Source: Ambulatory Visit | Attending: Obstetrics and Gynecology | Admitting: Obstetrics and Gynecology

## 2023-11-17 VITALS — BP 108/70 | Ht 70.0 in | Wt 259.0 lb

## 2023-11-17 DIAGNOSIS — Z124 Encounter for screening for malignant neoplasm of cervix: Secondary | ICD-10-CM

## 2023-11-17 DIAGNOSIS — Z1151 Encounter for screening for human papillomavirus (HPV): Secondary | ICD-10-CM

## 2023-11-17 DIAGNOSIS — Z01419 Encounter for gynecological examination (general) (routine) without abnormal findings: Secondary | ICD-10-CM

## 2023-11-17 DIAGNOSIS — Z3044 Encounter for surveillance of vaginal ring hormonal contraceptive device: Secondary | ICD-10-CM

## 2023-11-17 DIAGNOSIS — Z8041 Family history of malignant neoplasm of ovary: Secondary | ICD-10-CM

## 2023-11-17 DIAGNOSIS — L732 Hidradenitis suppurativa: Secondary | ICD-10-CM

## 2023-11-17 DIAGNOSIS — Z803 Family history of malignant neoplasm of breast: Secondary | ICD-10-CM

## 2023-11-17 MED ORDER — ETONOGESTREL-ETHINYL ESTRADIOL 0.12-0.015 MG/24HR VA RING: VAGINAL_RING | VAGINAL | 3 refills | Status: AC

## 2023-11-17 NOTE — Patient Instructions (Signed)
 I value your feedback and you entrusting [place] with your care. If you get a [place] patient survey, I would appreciate you taking the time to let us know about your experience today. Thank you! ? ? ?

## 2023-11-19 LAB — CYTOLOGY - PAP
Comment: NEGATIVE
Diagnosis: NEGATIVE
High risk HPV: NEGATIVE

## 2023-12-04 ENCOUNTER — Ambulatory Visit: Payer: BC Managed Care – PPO | Admitting: Physician Assistant

## 2023-12-04 ENCOUNTER — Encounter: Payer: Self-pay | Admitting: Physician Assistant

## 2023-12-04 VITALS — BP 113/82 | HR 69 | Resp 16 | Ht 70.0 in | Wt 253.6 lb

## 2023-12-04 DIAGNOSIS — F331 Major depressive disorder, recurrent, moderate: Secondary | ICD-10-CM | POA: Insufficient documentation

## 2023-12-04 DIAGNOSIS — F411 Generalized anxiety disorder: Secondary | ICD-10-CM | POA: Diagnosis not present

## 2023-12-04 DIAGNOSIS — E66812 Obesity, class 2: Secondary | ICD-10-CM | POA: Diagnosis not present

## 2023-12-04 DIAGNOSIS — Z6836 Body mass index (BMI) 36.0-36.9, adult: Secondary | ICD-10-CM | POA: Diagnosis not present

## 2023-12-04 NOTE — Assessment & Plan Note (Signed)
 Doing well with Wegovy, demonstrating steady continued weight loss at a healthy rate.  Continue the 0.5 mg weekly dose, refill available to her already.  Advised the importance of adequate protein intake on this medication as well as resistance training which will not only improve insulin sensitivity but also build muscle mass and reduce risk of atrophy.  Advised importance of portion control and slower eating on this medication to minimize GI side effects. Avoid high fat foods as these may cause discomfort. Patient aware of potential for weight regain when eventually stopping the medication. For this reason, continued efforts toward improving lifestyle will be particularly important moving forward.

## 2023-12-04 NOTE — Progress Notes (Addendum)
 Date:  12/04/2023   Name:  Sheila Gilbert   DOB:  Dec 21, 1990   MRN:  [id number]   Chief Complaint: Obesity   Dimond presents today for 1 month follow-up on weight management, currently on the Wegovy 0.50 mg weekly dose, continues to do well with this but thinks the effects are waning.  She is down an additional 6 pounds from last visit bringing her total weight loss to 31 pounds since starting 4 months ago.  She admits to possible complicating factor with recent workplace stressors/relationship problems.  She hopes to establish with a personal therapist and also a couples therapist.  She is debating restart of SSRI/SNRI. Previously used Lexapro with good effect for mood symptoms but notes this caused weight gain.    Medication list has been reviewed and updated.  Current Meds  Medication Sig   albuterol (VENTOLIN HFA) 108 (90 Base) MCG/ACT inhaler Inhale into the lungs.   busPIRone (BUSPAR) 5 MG tablet Take 5 mg by mouth 2 (two) times daily.   clindamycin (CLEOCIN-T) 1 % lotion Apply topically 2 (two) times daily. Apply topically BID prn   etonogestrel-ethinyl estradiol (NUVARING) 0.12-0.015 MG/24HR vaginal ring Insert vaginally and leave in place for 3 consecutive weeks, then remove for 1 week.   Semaglutide-Weight Management (WEGOVY) 0.5 MG/0.5ML SOAJ Inject 0.5 mg into the skin once a week.     Review of Systems  Patient Active Problem List   Diagnosis Date Noted   Generalized anxiety disorder 12/04/2023   Moderate episode of recurrent major depressive disorder (HCC) 12/04/2023   Mild hyperlipidemia 08/03/2023   Metabolic syndrome 08/03/2023   Class 2 severe obesity with serious comorbidity and body mass index (BMI) of 36.0 to 36.9 in adult [facility]) 07/31/2023   Prediabetes 07/31/2023   Family history of breast cancer 10/30/2022   Family history of ovarian cancer 10/30/2022   Hidradenitis suppurativa 04/09/2016    Allergies  Allergen Reactions   Bee Pollen    Dust Mite  Extract    Latex Swelling    Immunization History  Administered Date(s) Administered   Influenza, Seasonal, Injecte, Preservative Fre 07/31/2023   PFIZER(Purple Top)SARS-COV-2 Vaccination 02/09/2020, 03/02/2020    Past Surgical History:  Procedure Laterality Date   NO PAST SURGERIES      Social History   Tobacco Use   Smoking status: Never   Smokeless tobacco: Never  Vaping Use   Vaping status: Never Used  Substance Use Topics   Alcohol use: Not Currently   Drug use: No    Family History  Problem Relation Age of Onset   Hypertension Mother    Diabetes Mother    Hypertension Father    Heart disease Father    Diabetes Father    Breast cancer Maternal Aunt 30   Breast cancer Maternal Grandmother 50   Ovarian cancer Maternal Grandmother        50-60s        12/04/2023    9:31 AM 10/09/2023    8:29 AM 09/11/2023    8:10 AM 07/31/2023    8:41 AM  GAD 7 : Generalized Anxiety Score  Nervous, Anxious, on Edge 3 3 3 2   Control/stop worrying 3 2 3 1   Worry too much - different things 3 3 3 2   Trouble relaxing 3 2 2 1   Restless 1 2 3 3   Easily annoyed or irritable 2 1 2 2   Afraid - awful might happen 2 2 0 1  Total GAD 7 Score 17  15 16 12   Anxiety Difficulty  Somewhat difficult Somewhat difficult Somewhat difficult       12/04/2023    9:30 AM 10/09/2023    8:29 AM 09/11/2023    8:09 AM  Depression screen PHQ 2/9  Decreased Interest 1 2 1   Down, Depressed, Hopeless 1 1 1   PHQ - 2 Score 2 3 2   Altered sleeping 3 3 2   Tired, decreased energy 2 3 2   Change in appetite 1 0 0  Feeling bad or failure about yourself  2 1 1   Trouble concentrating 2 2 2   Moving slowly or fidgety/restless 1 2 3   Suicidal thoughts 0 0 0  PHQ-9 Score 13 14 12   Difficult doing work/chores  Not difficult at all     BP Readings from Last 3 Encounters:  12/04/23 113/82  11/17/23 108/70  11/06/23 114/78    Wt Readings from Last 3 Encounters:  12/04/23 253 lb 9.6 oz (115 kg)  11/17/23  259 lb (117.5 kg)  11/06/23 258 lb (117 kg)    BP 113/82 (BP Location: Left Leg, Patient Position: Standing;Sitting, Cuff Size: Large)   Pulse 69   Resp 16   Ht 5\' 10"  (1.778 m)   Wt 253 lb 9.6 oz (115 kg)   LMP 11/20/2023 (Exact Date)   SpO2 96%   BMI 36.39 kg/m   Physical Exam Vitals and nursing note reviewed.  Constitutional:      Appearance: Normal appearance.  Cardiovascular:     Rate and Rhythm: Normal rate.  Pulmonary:     Effort: Pulmonary effort is normal.  Abdominal:     General: There is no distension.  Musculoskeletal:        General: Normal range of motion.  Skin:    General: Skin is warm and dry.  Neurological:     Mental Status: She is alert and oriented to person, place, and time.     Gait: Gait is intact.  Psychiatric:        Mood and Affect: Mood and affect normal.     Recent Labs     Component Value Date/Time   NA 143 07/31/2023 1031   K 4.2 07/31/2023 1031   CL 107 (H) 07/31/2023 1031   CO2 22 07/31/2023 1031   GLUCOSE 98 07/31/2023 1031   GLUCOSE 108 (H) 12/03/2021 1246   BUN 8 07/31/2023 1031   CREATININE 0.62 07/31/2023 1031   CALCIUM 9.0 07/31/2023 1031   PROT 7.1 11/06/2023 1508   ALBUMIN 4.6 11/06/2023 1508   AST 32 11/06/2023 1508   ALT 67 (H) 11/06/2023 1508   ALKPHOS 73 11/06/2023 1508   BILITOT 0.3 11/06/2023 1508   GFRNONAA >60 12/03/2021 1246   GFRAA >60 10/06/2017 1944    Lab Results  Component Value Date   WBC 7.4 07/31/2023   HGB 12.8 07/31/2023   HCT 40.7 07/31/2023   MCV 89 07/31/2023   PLT 312 07/31/2023   Lab Results  Component Value Date   HGBA1C 5.9 (H) 11/06/2023   Lab Results  Component Value Date   CHOL 198 11/06/2023   HDL 38 (L) 11/06/2023   LDLCALC 134 (H) 11/06/2023   TRIG 146 11/06/2023   CHOLHDL 5.2 (H) 11/06/2023   Lab Results  Component Value Date   TSH 1.500 07/31/2023     Assessment and Plan:  Class 2 severe obesity with serious comorbidity and body mass index (BMI) of 36.0 to  36.9 in adult, unspecified obesity type [facility]) Assessment & Plan:  Doing well with Wegovy, demonstrating steady continued weight loss at a healthy rate.  Continue the 0.5 mg weekly dose, refill available to her already.  Advised the importance of adequate protein intake on this medication as well as resistance training which will not only improve insulin sensitivity but also build muscle mass and reduce risk of atrophy.  Advised importance of portion control and slower eating on this medication to minimize GI side effects. Avoid high fat foods as these may cause discomfort. Patient aware of potential for weight regain when eventually stopping the medication. For this reason, continued efforts toward improving lifestyle will be particularly important moving forward.     Generalized anxiety disorder Assessment & Plan: Encouraged to seek therapist.  Discussed potential restart of SSRI/SNRI.  Good option would be fluoxetine (Prozac) due to relative weight neutrality.  At this time, patient would like to wait on initiating pharmacotherapy, but will reach out if she changes her mind.   Moderate episode of recurrent major depressive disorder [facility]) Assessment & Plan: Plan as above      Return in about 4 weeks (around 01/01/2024) for OV f/u Wegovy.    Alvester Morin, PA-C, DMSc, Nutritionist [facility] Primary Care and Sports Medicine [facility] [phone]

## 2023-12-04 NOTE — Assessment & Plan Note (Signed)
 Plan as above.

## 2023-12-04 NOTE — Assessment & Plan Note (Signed)
 Encouraged to seek therapist.  Discussed potential restart of SSRI/SNRI.  Good option would be fluoxetine (Prozac) due to relative weight neutrality.  At this time, patient would like to wait on initiating pharmacotherapy, but will reach out if she changes her mind.

## 2023-12-08 ENCOUNTER — Ambulatory Visit: Admission: EM | Admit: 2023-12-08 | Discharge: 2023-12-08

## 2023-12-08 ENCOUNTER — Other Ambulatory Visit: Payer: Self-pay

## 2023-12-08 ENCOUNTER — Encounter: Payer: Self-pay | Admitting: Emergency Medicine

## 2023-12-08 ENCOUNTER — Emergency Department
Admission: EM | Admit: 2023-12-08 | Discharge: 2023-12-08 | Disposition: A | Discharge status: Home / Self Care | Attending: Emergency Medicine | Admitting: Emergency Medicine

## 2023-12-08 ENCOUNTER — Emergency Department

## 2023-12-08 DIAGNOSIS — N200 Calculus of kidney: Secondary | ICD-10-CM | POA: Diagnosis not present

## 2023-12-08 DIAGNOSIS — K529 Noninfective gastroenteritis and colitis, unspecified: Secondary | ICD-10-CM | POA: Insufficient documentation

## 2023-12-08 DIAGNOSIS — R160 Hepatomegaly, not elsewhere classified: Secondary | ICD-10-CM | POA: Diagnosis not present

## 2023-12-08 DIAGNOSIS — K921 Melena: Secondary | ICD-10-CM

## 2023-12-08 DIAGNOSIS — R1031 Right lower quadrant pain: Secondary | ICD-10-CM | POA: Diagnosis not present

## 2023-12-08 DIAGNOSIS — R109 Unspecified abdominal pain: Secondary | ICD-10-CM

## 2023-12-08 DIAGNOSIS — R1030 Lower abdominal pain, unspecified: Secondary | ICD-10-CM | POA: Diagnosis not present

## 2023-12-08 LAB — COMPREHENSIVE METABOLIC PANEL WITH GFR
ALT: 46 U/L — ABNORMAL HIGH (ref 0–44)
AST: 26 U/L (ref 15–41)
Albumin: 3.9 g/dL (ref 3.5–5.0)
Alkaline Phosphatase: 48 U/L (ref 38–126)
Anion gap: 10 (ref 5–15)
BUN: 12 mg/dL (ref 6–20)
CO2: 21 mmol/L — ABNORMAL LOW (ref 22–32)
Calcium: 9 mg/dL (ref 8.9–10.3)
Chloride: 108 mmol/L (ref 98–111)
Creatinine, Ser: 0.61 mg/dL (ref 0.44–1.00)
GFR, Estimated: >60 mL/min (ref >60)
Glucose, Bld: 87 mg/dL (ref 70–99)
Potassium: 3.8 mmol/L (ref 3.5–5.1)
Sodium: 139 mmol/L (ref 135–145)
Total Bilirubin: 0.6 mg/dL (ref 0.0–1.2)
Total Protein: 7.4 g/dL (ref 6.5–8.1)

## 2023-12-08 LAB — CBC
HCT: 41.4 % (ref 36.0–46.0)
Hemoglobin: 13.6 g/dL (ref 12.0–15.0)
MCH: 29.1 pg (ref 26.0–34.0)
MCHC: 32.9 g/dL (ref 30.0–36.0)
MCV: 88.5 fL (ref 80.0–100.0)
Platelets: 328 10*3/uL (ref 150–400)
RBC: 4.68 MIL/uL (ref 3.87–5.11)
RDW: 13.3 % (ref 11.5–15.5)
WBC: 9.2 10*3/uL (ref 4.0–10.5)
nRBC: 0 % (ref 0.0–0.2)

## 2023-12-08 LAB — URINALYSIS, ROUTINE W REFLEX MICROSCOPIC
Bilirubin Urine: NEGATIVE
Glucose, UA: NEGATIVE mg/dL
Hgb urine dipstick: NEGATIVE
Ketones, ur: NEGATIVE mg/dL
Nitrite: NEGATIVE
Protein, ur: NEGATIVE mg/dL
Specific Gravity, Urine: 1.029 (ref 1.005–1.030)
pH: 5 (ref 5.0–8.0)

## 2023-12-08 LAB — LIPASE, BLOOD: Lipase: 33 U/L (ref 11–51)

## 2023-12-08 LAB — POC URINE PREG, ED: Preg Test, Ur: NEGATIVE

## 2023-12-08 MED ORDER — CIPROFLOXACIN HCL 500 MG PO TABS
500.0000 mg | ORAL_TABLET | Freq: Once | ORAL | Status: AC
  Administered 2023-12-08: 500 mg via ORAL
  Filled 2023-12-08: qty 1

## 2023-12-08 MED ORDER — METRONIDAZOLE 500 MG PO TABS
500.0000 mg | ORAL_TABLET | Freq: Once | ORAL | Status: AC
  Administered 2023-12-08: 500 mg via ORAL
  Filled 2023-12-08: qty 1

## 2023-12-08 MED ORDER — IOHEXOL 350 MG/ML SOLN
100.0000 mL | Freq: Once | INTRAVENOUS | Status: AC | PRN
  Administered 2023-12-08: 100 mL via INTRAVENOUS

## 2023-12-08 MED ORDER — CIPROFLOXACIN HCL 500 MG PO TABS: 500.0000 mg | ORAL_TABLET | Freq: Two times a day (BID) | ORAL | 0 refills | Status: AC

## 2023-12-08 MED ORDER — METRONIDAZOLE 500 MG PO TABS: 500.0000 mg | ORAL_TABLET | Freq: Three times a day (TID) | ORAL | 0 refills | Status: AC

## 2023-12-08 MED ORDER — DICYCLOMINE HCL 10 MG PO CAPS: 10.0000 mg | ORAL_CAPSULE | Freq: Three times a day (TID) | ORAL | 0 refills | Status: AC | PRN

## 2023-12-08 MED ORDER — DICYCLOMINE HCL 10 MG PO CAPS
10.0000 mg | ORAL_CAPSULE | Freq: Once | ORAL | Status: AC
  Administered 2023-12-08: 10 mg via ORAL
  Filled 2023-12-08: qty 1

## 2023-12-08 NOTE — ED Provider Notes (Addendum)
 Renaldo Fiddler    CSN: [id number] Arrival date & time: 12/08/23  1600      History   Chief Complaint Chief Complaint  Patient presents with   Abdominal Pain   Back Pain   Headache    HPI Sheila Gilbert is a 33 y.o. female.  Patient presents with abdominal pain, worse in the right lower abdomen x 1 day.  She also reports right lower back pain.  She had diarrhea yesterday morning for approximately 1 hour and had bright red blood in her stool.  She had diarrhea once today and again noted bright red blood in her stool.  She states her stool is brown.  She reports possible subjective fever yesterday.  No OTC medication taken.  She denies nausea, vomiting, dysuria, hematuria, vaginal discharge, pelvic pain.  The history is provided by the patient and medical records.    Past Medical History:  Diagnosis Date   Allergy    Anxiety    Asthma    GERD (gastroesophageal reflux disease)     Patient Active Problem List   Diagnosis Date Noted   Generalized anxiety disorder 12/04/2023   Moderate episode of recurrent major depressive disorder (HCC) 12/04/2023   Mild hyperlipidemia 08/03/2023   Metabolic syndrome 08/03/2023   Class 2 severe obesity with serious comorbidity and body mass index (BMI) of 36.0 to 36.9 in adult [facility]) 07/31/2023   Prediabetes 07/31/2023   Family history of breast cancer 10/30/2022   Family history of ovarian cancer 10/30/2022   Hidradenitis suppurativa 04/09/2016    Past Surgical History:  Procedure Laterality Date   NO PAST SURGERIES      OB History     Gravida  1   Para  1   Term  1   Preterm      AB      Living  1      SAB      IAB      Ectopic      Multiple      Live Births  1        Obstetric Comments  1st Menstrual Cycle:  58  1st Pregnancy:  18           Home Medications    Prior to Admission medications   Medication Sig Start Date End Date Taking? Authorizing Provider  albuterol (VENTOLIN HFA) 108 (90  Base) MCG/ACT inhaler Inhale into the lungs. 12/03/21  Yes [provider]  busPIRone (BUSPAR) 5 MG tablet Take 5 mg by mouth 2 (two) times daily. 09/15/22  Yes [provider]  clindamycin (CLEOCIN-T) 1 % lotion Apply topically 2 (two) times daily. Apply topically BID prn 11/12/23 11/11/24 Yes Elie Goody, MD  etonogestrel-ethinyl estradiol (NUVARING) 0.12-0.015 MG/24HR vaginal ring Insert vaginally and leave in place for 3 consecutive weeks, then remove for 1 week. 11/17/23  Yes Copland, Ilona Sorrel, PA-C  Semaglutide-Weight Management (WEGOVY) 0.5 MG/0.5ML SOAJ Inject 0.5 mg into the skin once a week. 11/06/23  Yes Remo Lipps, PA    Family History Family History  Problem Relation Age of Onset   Hypertension Mother    Diabetes Mother    Hypertension Father    Heart disease Father    Diabetes Father    Breast cancer Maternal Aunt 30   Breast cancer Maternal Grandmother 29   Ovarian cancer Maternal Grandmother        25-60s    Social History Social History   Tobacco Use   Smoking  status: Never   Smokeless tobacco: Never  Vaping Use   Vaping status: Never Used  Substance Use Topics   Alcohol use: Not Currently   Drug use: No     Allergies   Bee pollen, Dust mite extract, and Latex   Review of Systems Review of Systems  Constitutional:  Positive for fever. Negative for chills.  Respiratory:  Negative for cough and shortness of breath.   Cardiovascular:  Negative for chest pain and palpitations.  Gastrointestinal:  Positive for abdominal pain, blood in stool and diarrhea. Negative for nausea and vomiting.  Genitourinary:  Positive for flank pain. Negative for dysuria, hematuria, pelvic pain and vaginal discharge.     Physical Exam Triage Vital Signs ED Triage Vitals  Encounter Vitals Group     BP 12/08/23 1618 127/89     Systolic BP Percentile --      Diastolic BP Percentile --      Pulse Rate 12/08/23 1618 71     Resp 12/08/23 1618 16      Temp 12/08/23 1618 98.8 F (37.1 C)     Temp Source 12/08/23 1618 Oral     SpO2 12/08/23 1618 97 %     Weight 12/08/23 1618 248 lb (112.5 kg)     Height 12/08/23 1618 5\' 9"  (1.753 m)     Head Circumference --      Peak Flow --      Pain Score 12/08/23 1633 7     Pain Loc --      Pain Education --      Exclude from Growth Chart --    No data found.  Updated Vital Signs BP 127/89 (BP Location: Left Arm)   Pulse 71   Temp 98.8 F (37.1 C) (Oral)   Resp 16   Ht 5\' 9"  (1.753 m)   Wt 248 lb (112.5 kg)   LMP 11/20/2023 (Exact Date)   SpO2 97%   BMI 36.62 kg/m   Visual Acuity Right Eye Distance:   Left Eye Distance:   Bilateral Distance:    Right Eye Near:   Left Eye Near:    Bilateral Near:     Physical Exam Constitutional:      General: She is not in acute distress. HENT:     Mouth/Throat:     Mouth: Mucous membranes are moist.  Cardiovascular:     Rate and Rhythm: Normal rate and regular rhythm.     Heart sounds: Normal heart sounds.  Pulmonary:     Effort: Pulmonary effort is normal. No respiratory distress.     Breath sounds: Normal breath sounds.  Abdominal:     General: Bowel sounds are normal.     Palpations: Abdomen is soft.     Tenderness: There is abdominal tenderness in the right lower quadrant. There is right CVA tenderness. There is no left CVA tenderness, guarding or rebound.  Neurological:     Mental Status: She is alert.      UC Treatments / Results  Labs (all labs ordered are listed, but only abnormal results are displayed) Labs Reviewed - No data to display  EKG   Radiology No results found.  Procedures Procedures (including critical care time)  Medications Ordered in UC Medications - No data to display  Initial Impression / Assessment and Plan / UC Course  I have reviewed the triage vital signs and the nursing notes.  Pertinent labs & imaging results that were available during my care of the patient were  reviewed by me and  considered in my medical decision making (see chart for details).    Right lower quadrant abdominal pain, right flank pain, hematochezia.  Afebrile and vital signs are stable.  Sending patient to the ED for evaluation of her abdominal pain and other symptoms.  She is agreeable to this.  She declines EMS and feels stable to drive herself to [facility] ED.  Final Clinical Impressions(s) / UC Diagnoses   Final diagnoses:  Right lower quadrant abdominal pain  Right flank pain  Hematochezia     Discharge Instructions      Go to the emergency department for your abdominal and flank pain.  And the blood in your stool.     ED Prescriptions   None    PDMP not reviewed this encounter.   Mickie Bail, NP 12/08/23 1654    Mickie Bail, NP 12/08/23 [phone]

## 2023-12-08 NOTE — ED Triage Notes (Signed)
 Patient to ED via POV for lower abd pain. States she has been having bloody diarrhea since yesterday. Also having back pain and headache.

## 2023-12-08 NOTE — Discharge Instructions (Signed)
 Go to the emergency department for your abdominal and flank pain.  And the blood in your stool.

## 2023-12-08 NOTE — ED Triage Notes (Signed)
 Pt c/o lower abd/back pain x1 day & HA since today. States yesterday she woke up w/diarrhea & blood in her stool. States pain worse w/defecating. No OTC meds.

## 2023-12-08 NOTE — ED Provider Triage Note (Signed)
 Emergency Medicine Provider Triage Evaluation Note  Sheila Gilbert , a 33 y.o. female  was evaluated in triage.  Pt complains of lower abdominal pain, bloody diarrhea. Started yesterday.  Review of Systems  Positive: Subjective fever, lower abdominal pain, bloody diarrhea Negative:   Physical Exam  BP (!) 143/97 (BP Location: Left Arm)   Pulse 71   Temp 99.1 F (37.3 C) (Oral)   Resp 17   Ht 5\' 9"  (1.753 m)   Wt 112.5 kg   LMP 11/20/2023 (Exact Date)   SpO2 99%   BMI 36.62 kg/m  Gen:   Awake, no distress   Resp:  Normal effort  MSK:   Moves extremities without difficulty  Other:    Medical Decision Making  Medically screening exam initiated at 5:30 PM.  Appropriate orders placed.  Barby Colvard was informed that the remainder of the evaluation will be completed by another provider, this initial triage assessment does not replace that evaluation, and the importance of remaining in the ED until their evaluation is complete.     Cameron Ali, PA-C 12/08/23 1731

## 2023-12-08 NOTE — ED Provider Notes (Signed)
 [facility] Provider Note    Event Date/Time   First MD Initiated Contact with Patient 12/08/23 2009     (approximate)  History   Chief Complaint: Abdominal Pain  HPI  Sheila Gilbert is a 33 y.o. female with a past medical history of anxiety, gastric reflux, presents to the emergency department for lower abdominal discomfort and diarrhea.  According to the patient since yesterday she has been experiencing lower abdominal discomfort as well as diarrhea.  She noted today some blood within the diarrhea she was concerned so she came to the emergency department for evaluation.  Patient denies any nausea or vomiting.  Denies any known fever although states some chills yesterday.  Denies any vaginal bleeding or discharge.  Physical Exam   Triage Vital Signs: ED Triage Vitals [12/08/23 1726]  Encounter Vitals Group     BP (!) 143/97     Systolic BP Percentile      Diastolic BP Percentile      Pulse Rate 71     Resp 17     Temp 99.1 F (37.3 C)     Temp Source Oral     SpO2 99 %     Weight 248 lb (112.5 kg)     Height 5\' 9"  (1.753 m)     Head Circumference      Peak Flow      Pain Score 7     Pain Loc      Pain Education      Exclude from Growth Chart     Most recent vital signs: Vitals:   12/08/23 1726 12/08/23 2011  BP: (!) 143/97 133/87  Pulse: 71 76  Resp: 17 16  Temp: 99.1 F (37.3 C) 98 F (36.7 C)  SpO2: 99% 98%    General: Awake, no distress.  CV:  Good peripheral perfusion.  Regular rate and rhythm  Resp:  Normal effort.  Equal breath sounds bilaterally.  Abd:  No distention.  Soft, mild abdominal pain across the lower abdomen somewhat more so on the right lower quadrant.  No rebound or guarding.  ED Results / Procedures / Treatments   RADIOLOGY  I have reviewed and interpreted CT images.  No obvious obstruction or significant abnormality seen on my evaluation. Radiology has read the CT scan as pericolonic fat stranding could  represent mild colitis.  Otherwise reassuring.   MEDICATIONS ORDERED IN ED: Medications - No data to display   IMPRESSION / MDM / ASSESSMENT AND PLAN / ED COURSE  I reviewed the triage vital signs and the nursing notes.  Patient's presentation is most consistent with acute presentation with potential threat to life or bodily function.  Patient presents emergency department for lower abdominal pain and diarrhea since last night and with some blood located within the stool.  Overall patient appears well.  Mild to moderate tenderness across the lower abdomen somewhat worse in the right lower quadrant.  Lab work today shows a reassuring CBC with a normal white blood cell count, reassuring chemistry negative LFTs lipase and a negative pregnancy test.  Urinalysis is pending.  Given the patient's diarrhea lower abdominal pain more so in the right lower quadrant with blood within the stool we will obtain a CT scan to further evaluate.  Differential would include colitis, diverticulitis, appendicitis or UTI.  Patient denies any history of IBD.  Patient CT scan shows signs of mild colitis which also fits the patient's clinical presentation.  Patient's lab work is reassuring.  Given the patient's likely colitis we will cover with antibiotics ciprofloxacin and Flagyl as well as Bentyl to be used if needed.  Will have the patient follow-up with GI medicine for further evaluation.  Patient agreeable to plan of care.  Discussed return precautions.  FINAL CLINICAL IMPRESSION(S) / ED DIAGNOSES   Diarrhea Lower abdominal pain Colitis   Note:  This document was prepared using Dragon voice recognition software and may include unintentional dictation errors.   Minna Antis, MD 12/08/23 2147

## 2023-12-08 NOTE — Discharge Instructions (Signed)
 Please take antibiotics as prescribed for their entire course.  Please take your Bentyl as needed for discomfort as written.  Please call the number provided for GI medicine to arrange a follow-up appointment.  Return to the emergency department for any significant worsening of pain, significant increase in bleeding, or any other symptom personally concerning to yourself.

## 2023-12-08 NOTE — ED Notes (Signed)
 Patient is being discharged from the Urgent Care and sent to the Emergency Department via POV . Per Wendee Beavers NP, patient is in need of higher level of care due to abd pain & hematochezia. Patient is aware and verbalizes understanding of plan of care.  Vitals:   12/08/23 1618  BP: 127/89  Pulse: 71  Resp: 16  Temp: 98.8 F (37.1 C)  SpO2: 97%

## 2023-12-10 ENCOUNTER — Ambulatory Visit: Admitting: Physician Assistant

## 2023-12-27 ENCOUNTER — Other Ambulatory Visit: Payer: Self-pay | Admitting: Physician Assistant

## 2023-12-28 NOTE — Telephone Encounter (Signed)
 Please review. Next dosage? Pt has a appointment on 4/28.  KP

## 2023-12-28 NOTE — Telephone Encounter (Signed)
 Requested medication (s) are due for refill today: yes   Requested medication (s) are on the active medication list: yes   Last refill:  11/06/23 #2 ml 1 refills  Future visit scheduled: no   Notes to clinic:  last labs 11/06/23 do you want to allow to dispense 4 ml 0 refills? Per pharmacy request     Requested Prescriptions  Pending Prescriptions Disp Refills   WEGOVY  0.5 MG/0.5ML SOAJ [Pharmacy Med Name: Wegovy  0.5 MG/0.5ML Subcutaneous Solution Auto-injector] 4 mL 0    Sig: INJECT 1 DOSE (0.5MG ) SUBCUTANEOUSLY ONCE A WEEK     Endocrinology:  Diabetes - GLP-1 Receptor Agonists - semaglutide  Failed - 12/28/2023  2:12 PM      Failed - HBA1C in normal range and within 180 days    Hgb A1c MFr Bld  Date Value Ref Range Status  11/06/2023 5.9 (H) 4.8 - 5.6 % Final    Comment:             Prediabetes: 5.7 - 6.4          Diabetes: >6.4          Glycemic control for adults with diabetes: <7.0          Passed - Cr in normal range and within 360 days    Creatinine, Ser  Date Value Ref Range Status  12/08/2023 0.61 0.44 - 1.00 mg/dL Final         Passed - Valid encounter within last 6 months    Recent Outpatient Visits           3 weeks ago Class 2 severe obesity with serious comorbidity and body mass index (BMI) of 36.0 to 36.9 in adult, unspecified obesity type [facility])   [place] [facility] at [facility], Arleen Lacer, PA   1 month ago Class 2 severe obesity with serious comorbidity and body mass index (BMI) of 37.0 to 37.9 in adult, unspecified obesity type [facility])   [place] [facility] at [facility], Arleen Lacer, PA       Future Appointments             In 3 weeks Harris Liming, MD [facility]

## 2023-12-31 ENCOUNTER — Telehealth: Payer: Self-pay

## 2023-12-31 NOTE — Telephone Encounter (Signed)
 PA completed waiting on insurance approval.  Key: [id number]  KP

## 2024-01-01 NOTE — Telephone Encounter (Signed)
 Approved   12/31/23-12/30/24  KP

## 2024-01-04 ENCOUNTER — Encounter: Payer: Self-pay | Admitting: Physician Assistant

## 2024-01-04 ENCOUNTER — Ambulatory Visit: Admitting: Physician Assistant

## 2024-01-04 VITALS — BP 132/84 | HR 84 | Temp 98.3°F | Ht 70.0 in | Wt 245.0 lb

## 2024-01-04 DIAGNOSIS — R109 Unspecified abdominal pain: Secondary | ICD-10-CM

## 2024-01-04 DIAGNOSIS — Z6835 Body mass index (BMI) 35.0-35.9, adult: Secondary | ICD-10-CM

## 2024-01-04 DIAGNOSIS — N200 Calculus of kidney: Secondary | ICD-10-CM

## 2024-01-04 DIAGNOSIS — E66812 Obesity, class 2: Secondary | ICD-10-CM | POA: Diagnosis not present

## 2024-01-04 MED ORDER — TAMSULOSIN HCL 0.4 MG PO CAPS: 0.4000 mg | ORAL_CAPSULE | Freq: Every day | ORAL | 0 refills | Status: DC

## 2024-01-04 NOTE — Progress Notes (Signed)
 Date:  01/04/2024   Name:  Sheila Gilbert   DOB:  1991-03-22   MRN:  [id number]   Chief Complaint: Weight Check (Down 3 lbs)  HPI Sheila Gilbert presents today for 1 month follow-up on weight management, currently on the Wegovy  0.50 mg weekly dose, continues to do well with this. She is down an additional 3 pounds from last visit bringing her total weight loss to 40 pounds since starting Nov 2024.  Lower abdominal pain, intermittent since ED visit 12/08/23 diagnosed with mild colitis per CT abd/pelv, labs all reassuring, treated with cipro  and Flagyl . Also has rx for Bentyl . The CT also picked up a 5 mm right renal stone which patient says they did not mention to her. Still complains of intermittent sharp pain of the RLQ with associated sweating. She had some constipation initially when arriving in [place] last week, had a BM after 3 days, and returned a few days ago. Over the weekend she has had multiple BM, mostly formed but not requiring strain.   Medication list has been reviewed and updated.  Current Meds  Medication Sig   albuterol  (VENTOLIN  HFA) 108 (90 Base) MCG/ACT inhaler Inhale into the lungs.   busPIRone (BUSPAR) 5 MG tablet Take 5 mg by mouth 2 (two) times daily.   clindamycin  (CLEOCIN -T) 1 % lotion Apply topically 2 (two) times daily. Apply topically BID prn   dicyclomine  (BENTYL ) 10 MG capsule Take 1 capsule (10 mg total) by mouth 3 (three) times daily as needed for spasms.   etonogestrel -ethinyl estradiol  (NUVARING) 0.12-0.015 MG/24HR vaginal ring Insert vaginally and leave in place for 3 consecutive weeks, then remove for 1 week.   tamsulosin (FLOMAX) 0.4 MG CAPS capsule Take 1 capsule (0.4 mg total) by mouth daily.   [DISCONTINUED] WEGOVY  0.5 MG/0.5ML SOAJ Inject 0.5 mg into the skin once a week.     Review of Systems  Patient Active Problem List   Diagnosis Date Noted   Generalized anxiety disorder 12/04/2023   Moderate episode of recurrent major depressive disorder (HCC)  12/04/2023   Mild hyperlipidemia 08/03/2023   Metabolic syndrome 08/03/2023   Class 2 severe obesity with serious comorbidity in adult [facility]) 07/31/2023   Prediabetes 07/31/2023   Family history of breast cancer 10/30/2022   Family history of ovarian cancer 10/30/2022   Hidradenitis suppurativa 04/09/2016    Allergies  Allergen Reactions   Bee Pollen    Dust Mite Extract    Latex Swelling    Immunization History  Administered Date(s) Administered   Influenza, Seasonal, Injecte, Preservative Fre 07/31/2023   PFIZER(Purple Top)SARS-COV-2 Vaccination 02/09/2020, 03/02/2020    Past Surgical History:  Procedure Laterality Date   NO PAST SURGERIES      Social History   Tobacco Use   Smoking status: Never   Smokeless tobacco: Never  Vaping Use   Vaping status: Never Used  Substance Use Topics   Alcohol use: Not Currently   Drug use: No    Family History  Problem Relation Age of Onset   Hypertension Mother    Diabetes Mother    Hypertension Father    Heart disease Father    Diabetes Father    Breast cancer Maternal Aunt 30   Breast cancer Maternal Grandmother 50   Ovarian cancer Maternal Grandmother        50-60s        01/04/2024    9:08 AM 12/04/2023    9:31 AM 10/09/2023    8:29 AM 09/11/2023  8:10 AM  GAD 7 : Generalized Anxiety Score  Nervous, Anxious, on Edge 3 3 3 3   Control/stop worrying 2 3 2 3   Worry too much - different things 2 3 3 3   Trouble relaxing 2 3 2 2   Restless 1 1 2 3   Easily annoyed or irritable 2 2 1 2   Afraid - awful might happen 2 2 2  0  Total GAD 7 Score 14 17 15 16   Anxiety Difficulty Somewhat difficult  Somewhat difficult Somewhat difficult       01/04/2024    9:08 AM 12/04/2023    9:30 AM 10/09/2023    8:29 AM  Depression screen PHQ 2/9  Decreased Interest 2 1 2   Down, Depressed, Hopeless 2 1 1   PHQ - 2 Score 4 2 3   Altered sleeping 2 3 3   Tired, decreased energy 3 2 3   Change in appetite 0 1 0  Feeling bad or failure  about yourself  1 2 1   Trouble concentrating 2 2 2   Moving slowly or fidgety/restless 1 1 2   Suicidal thoughts 0 0 0  PHQ-9 Score 13 13 14   Difficult doing work/chores Not difficult at all  Not difficult at all    BP Readings from Last 3 Encounters:  01/04/24 132/84  12/08/23 122/83  12/08/23 127/89    Wt Readings from Last 3 Encounters:  01/04/24 245 lb (111.1 kg)  12/08/23 248 lb (112.5 kg)  12/08/23 248 lb (112.5 kg)    BP 132/84   Pulse 84   Temp 98.3 F (36.8 C)   Ht 5\' 10"  (1.778 m)   Wt 245 lb (111.1 kg)   SpO2 97%   BMI 35.15 kg/m   Physical Exam Vitals and nursing note reviewed.  Constitutional:      Appearance: Normal appearance.  Cardiovascular:     Rate and Rhythm: Normal rate and regular rhythm.     Heart sounds: No murmur heard.    No friction rub. No gallop.  Pulmonary:     Effort: Pulmonary effort is normal.     Breath sounds: Normal breath sounds.  Abdominal:     General: Bowel sounds are decreased. There is no distension.     Palpations: Abdomen is soft.     Tenderness: There is abdominal tenderness (mild-mod) in the right lower quadrant, left upper quadrant and left lower quadrant. There is no right CVA tenderness or left CVA tenderness.  Musculoskeletal:        General: Normal range of motion.  Skin:    General: Skin is warm and dry.  Neurological:     Mental Status: She is alert and oriented to person, place, and time.     Gait: Gait is intact.  Psychiatric:        Mood and Affect: Mood and affect normal.     Recent Labs     Component Value Date/Time   NA 139 12/08/2023 1728   NA 143 07/31/2023 1031   K 3.8 12/08/2023 1728   CL 108 12/08/2023 1728   CO2 21 (L) 12/08/2023 1728   GLUCOSE 87 12/08/2023 1728   BUN 12 12/08/2023 1728   BUN 8 07/31/2023 1031   CREATININE 0.61 12/08/2023 1728   CALCIUM 9.0 12/08/2023 1728   PROT 7.4 12/08/2023 1728   PROT 7.1 11/06/2023 1508   ALBUMIN 3.9 12/08/2023 1728   ALBUMIN 4.6 11/06/2023  1508   AST 26 12/08/2023 1728   ALT 46 (H) 12/08/2023 1728   ALKPHOS 48  12/08/2023 1728   BILITOT 0.6 12/08/2023 1728   BILITOT 0.3 11/06/2023 1508   GFRNONAA >60 12/08/2023 1728   GFRAA >60 10/06/2017 1944    Lab Results  Component Value Date   WBC 9.2 12/08/2023   HGB 13.6 12/08/2023   HCT 41.4 12/08/2023   MCV 88.5 12/08/2023   PLT 328 12/08/2023   Lab Results  Component Value Date   HGBA1C 5.9 (H) 11/06/2023   Lab Results  Component Value Date   CHOL 198 11/06/2023   HDL 38 (L) 11/06/2023   LDLCALC 134 (H) 11/06/2023   TRIG 146 11/06/2023   CHOLHDL 5.2 (H) 11/06/2023   Lab Results  Component Value Date   TSH 1.500 07/31/2023     Assessment and Plan:  1. Class 2 severe obesity with serious comorbidity and body mass index (BMI) of 35.0 to 35.9 in adult, unspecified obesity type (HCC) (Primary) Continue at the current dose of Wegovy  0.5 mg/wk. I do not expect a direct relationship between Wegovy  and the recent bowel issues since dose has been stable for a few months, but no dose increase until bowel issues stabilize.   2. Nephrolithiasis Intermittent stabbing pain with 5 mm stone on imaging. Treat with tamsulosin and push fluids.  - tamsulosin (FLOMAX) 0.4 MG CAPS capsule; Take 1 capsule (0.4 mg total) by mouth daily.  Dispense: 15 capsule; Refill: 0  3. Intermittent abdominal pain Right renal stone may be responsible for her abdominal pain, though it sounds like there was some degree of colitis as well.  Encouraged patient to follow-up with GI referral, but if her pain resolves with the use of tamsulosin, she will have the option to cancel the specialist appointment if desired.   Return in about 4 weeks (around 02/01/2024) for OV f/u Wegovy .    Cody Das, PA-C, DMSc, Nutritionist [facility] [phone]

## 2024-01-21 ENCOUNTER — Ambulatory Visit: Admitting: Dermatology

## 2024-01-24 ENCOUNTER — Other Ambulatory Visit: Payer: Self-pay | Admitting: Physician Assistant

## 2024-01-26 NOTE — Telephone Encounter (Signed)
 Unable to refill per protocol, Rx expired.  Requested Prescriptions  Pending Prescriptions Disp Refills   WEGOVY  0.5 MG/0.5ML SOAJ [Pharmacy Med Name: Wegovy  0.5 MG/0.5ML Subcutaneous Solution Auto-injector] 4 mL 0    Sig: INJECT 0.5MG  INTO THE SKIN ONCE A WEEK     Endocrinology:  Diabetes - GLP-1 Receptor Agonists - semaglutide  Failed - 01/26/2024  4:28 PM      Failed - HBA1C in normal range and within 180 days    Hgb A1c MFr Bld  Date Value Ref Range Status  11/06/2023 5.9 (H) 4.8 - 5.6 % Final    Comment:             Prediabetes: 5.7 - 6.4          Diabetes: >6.4          Glycemic control for adults with diabetes: <7.0          Passed - Cr in normal range and within 360 days    Creatinine, Ser  Date Value Ref Range Status  12/08/2023 0.61 0.44 - 1.00 mg/dL Final         Passed - Valid encounter within last 6 months    Recent Outpatient Visits           3 weeks ago Class 2 severe obesity with serious comorbidity and body mass index (BMI) of 35.0 to 35.9 in adult, unspecified obesity type [facility])   [place] [facility] at [facility], Arleen Lacer, [place]   1 month ago Class 2 severe obesity with serious comorbidity and body mass index (BMI) of 36.0 to 36.9 in adult, unspecified obesity type [facility])   [place] [facility] at [facility], Arleen Lacer, [place]   2 months ago Class 2 severe obesity with serious comorbidity and body mass index (BMI) of 37.0 to 37.9 in adult, unspecified obesity type [facility])   [facility] at [facility], Arleen Lacer, PA       Future Appointments             In 3 days Larkin Plumb, Arleen Lacer, PA [facility] at [facility]   In 1 week Harris Liming, MD [facility]

## 2024-01-29 ENCOUNTER — Encounter: Payer: Self-pay | Admitting: Physician Assistant

## 2024-01-29 ENCOUNTER — Ambulatory Visit: Admitting: Physician Assistant

## 2024-01-29 VITALS — BP 126/84 | HR 90 | Temp 98.0°F | Ht 70.0 in | Wt 244.0 lb

## 2024-01-29 DIAGNOSIS — Z6835 Body mass index (BMI) 35.0-35.9, adult: Secondary | ICD-10-CM

## 2024-01-29 DIAGNOSIS — E66812 Obesity, class 2: Secondary | ICD-10-CM

## 2024-01-29 MED ORDER — WEGOVY 1 MG/0.5ML ~~LOC~~ SOAJ: 1.0000 mg | SUBCUTANEOUS | 0 refills | Status: DC

## 2024-01-29 NOTE — Assessment & Plan Note (Addendum)
 Sheila Gilbert continues to do extremely well now having lost over 40 lb on Wegovy . Weight loss has slowed over the last 2 months so we will proceed with dose increase today. She has gone from Class 3 to Class 2 and quite nearly Class 1 as of today. She would like to keep our follow-ups monthly for now.   Advised the importance of adequate protein intake on this medication as well as resistance training which will not only improve insulin sensitivity but also build muscle mass and reduce risk of atrophy.  Advised importance of portion control and slower eating on this medication to minimize GI side effects. Avoid high fat foods as these may cause discomfort. Patient aware of potential for weight regain when eventually stopping the medication. For this reason, continued efforts toward improving lifestyle will be particularly important moving forward.

## 2024-01-29 NOTE — Progress Notes (Signed)
 Date:  01/29/2024   Name:  Sheila Gilbert   DOB:  10-Mar-1991   MRN:  [id number]   Chief Complaint: Weight Check (Down 1 lbs )  HPI Sheila Gilbert presents today for 1 month follow-up on weight management, currently on the Wegovy  0.50 mg weekly dose, continues to do well with this. She is down an additional 1 pound from last visit bringing her total weight loss to 41 pounds since starting Nov 2024. She has incorporated dedicated strength training into her routine and her husband is working out with her as well; she goes 3x per week and does 30 minutes cardio and 30 minutes strength. She also goes on walks at least twice per week. Overall she is quite satisfied with her progress. Since she started running again, her anxiety and depressive symptoms have improved.   ED visit 12/08/23 diagnosed with mild colitis per CT abd/pelv, labs all reassuring, treated with cipro  and Flagyl . Also has rx for Bentyl . The CT also picked up a 5 mm right renal stone which patient says they did not mention to her. I gave her tamsulosin  at our last visit but she did not see any stones or blood in urine, and has since finished the medicine. No continued abdominal or back pain.   Medication list has been reviewed and updated.  Current Meds  Medication Sig   albuterol  (VENTOLIN  HFA) 108 (90 Base) MCG/ACT inhaler Inhale into the lungs.   busPIRone (BUSPAR) 5 MG tablet Take 5 mg by mouth 2 (two) times daily.   clindamycin  (CLEOCIN -T) 1 % lotion Apply topically 2 (two) times daily. Apply topically BID prn   dicyclomine  (BENTYL ) 10 MG capsule Take 1 capsule (10 mg total) by mouth 3 (three) times daily as needed for spasms.   etonogestrel -ethinyl estradiol  (NUVARING) 0.12-0.015 MG/24HR vaginal ring Insert vaginally and leave in place for 3 consecutive weeks, then remove for 1 week.   WEGOVY  1 MG/0.5ML SOAJ Inject 1 mg into the skin once a week. Use this dose for 1 month (4 shots) and then increase to next higher dose.    [DISCONTINUED] tamsulosin  (FLOMAX ) 0.4 MG CAPS capsule Take 1 capsule (0.4 mg total) by mouth daily.     Review of Systems  Patient Active Problem List   Diagnosis Date Noted   Generalized anxiety disorder 12/04/2023   Moderate episode of recurrent major depressive disorder (HCC) 12/04/2023   Mild hyperlipidemia 08/03/2023   Metabolic syndrome 08/03/2023   Class 2 severe obesity with serious comorbidity in adult [facility]) 07/31/2023   Prediabetes 07/31/2023   Family history of breast cancer 10/30/2022   Family history of ovarian cancer 10/30/2022   Hidradenitis suppurativa 04/09/2016    Allergies  Allergen Reactions   Bee Pollen    Dust Mite Extract    Latex Swelling    Immunization History  Administered Date(s) Administered   Influenza, Seasonal, Injecte, Preservative Fre 07/31/2023   PFIZER(Purple Top)SARS-COV-2 Vaccination 02/09/2020, 03/02/2020    Past Surgical History:  Procedure Laterality Date   NO PAST SURGERIES      Social History   Tobacco Use   Smoking status: Never   Smokeless tobacco: Never  Vaping Use   Vaping status: Never Used  Substance Use Topics   Alcohol use: Not Currently   Drug use: No    Family History  Problem Relation Age of Onset   Hypertension Mother    Diabetes Mother    Hypertension Father    Heart disease Father    Diabetes Father  Breast cancer Maternal Aunt 30   Breast cancer Maternal Grandmother 50   Ovarian cancer Maternal Grandmother        50-60s        01/29/2024   10:03 AM 01/04/2024    9:08 AM 12/04/2023    9:31 AM 10/09/2023    8:29 AM  GAD 7 : Generalized Anxiety Score  Nervous, Anxious, on Edge 2 3 3 3   Control/stop worrying 2 2 3 2   Worry too much - different things 2 2 3 3   Trouble relaxing  2 3 2   Restless 1 1 1 2   Easily annoyed or irritable 2 2 2 1   Afraid - awful might happen 2 2 2 2   Total GAD 7 Score  14 17 15   Anxiety Difficulty Somewhat difficult Somewhat difficult  Somewhat difficult        01/29/2024   10:02 AM 01/04/2024    9:08 AM 12/04/2023    9:30 AM  Depression screen PHQ 2/9  Decreased Interest 1 2 1   Down, Depressed, Hopeless 1 2 1   PHQ - 2 Score 2 4 2   Altered sleeping 0 2 3  Tired, decreased energy 2 3 2   Change in appetite 0 0 1  Feeling bad or failure about yourself  0 1 2  Trouble concentrating 2 2 2   Moving slowly or fidgety/restless 0 1 1  Suicidal thoughts 0 0 0  PHQ-9 Score 6 13 13   Difficult doing work/chores Not difficult at all Not difficult at all     BP Readings from Last 3 Encounters:  01/29/24 126/84  01/04/24 132/84  12/08/23 122/83    Wt Readings from Last 3 Encounters:  01/29/24 244 lb (110.7 kg)  01/04/24 245 lb (111.1 kg)  12/08/23 248 lb (112.5 kg)    BP 126/84   Pulse 90   Temp 98 F (36.7 C)   Ht 5\' 10"  (1.778 m)   Wt 244 lb (110.7 kg)   SpO2 97%   BMI 35.01 kg/m   Physical Exam Vitals and nursing note reviewed.  Constitutional:      Appearance: Normal appearance.  Cardiovascular:     Rate and Rhythm: Normal rate.  Pulmonary:     Effort: Pulmonary effort is normal.  Abdominal:     General: There is no distension.  Musculoskeletal:        General: Normal range of motion.  Skin:    General: Skin is warm and dry.  Neurological:     Mental Status: She is alert and oriented to person, place, and time.     Gait: Gait is intact.  Psychiatric:        Mood and Affect: Mood and affect normal.     Recent Labs     Component Value Date/Time   NA 139 12/08/2023 1728   NA 143 07/31/2023 1031   K 3.8 12/08/2023 1728   CL 108 12/08/2023 1728   CO2 21 (L) 12/08/2023 1728   GLUCOSE 87 12/08/2023 1728   BUN 12 12/08/2023 1728   BUN 8 07/31/2023 1031   CREATININE 0.61 12/08/2023 1728   CALCIUM 9.0 12/08/2023 1728   PROT 7.4 12/08/2023 1728   PROT 7.1 11/06/2023 1508   ALBUMIN 3.9 12/08/2023 1728   ALBUMIN 4.6 11/06/2023 1508   AST 26 12/08/2023 1728   ALT 46 (H) 12/08/2023 1728   ALKPHOS 48 12/08/2023 1728    BILITOT 0.6 12/08/2023 1728   BILITOT 0.3 11/06/2023 1508   GFRNONAA >60 12/08/2023 1728  GFRAA >60 10/06/2017 1944    Lab Results  Component Value Date   WBC 9.2 12/08/2023   HGB 13.6 12/08/2023   HCT 41.4 12/08/2023   MCV 88.5 12/08/2023   PLT 328 12/08/2023   Lab Results  Component Value Date   HGBA1C 5.9 (H) 11/06/2023   Lab Results  Component Value Date   CHOL 198 11/06/2023   HDL 38 (L) 11/06/2023   LDLCALC 134 (H) 11/06/2023   TRIG 146 11/06/2023   CHOLHDL 5.2 (H) 11/06/2023   Lab Results  Component Value Date   TSH 1.500 07/31/2023     Assessment and Plan:  Class 2 severe obesity with serious comorbidity and body mass index (BMI) of 35.0 to 35.9 in adult, unspecified obesity type [facility]) Assessment & Plan: Lailyn continues to do extremely well now having lost over 40 lb on Wegovy . Weight loss has slowed over the last 2 months so we will proceed with dose increase today. She has gone from Class 3 to Class 2 and quite nearly Class 1 as of today. She would like to keep our follow-ups monthly for now.   Advised the importance of adequate protein intake on this medication as well as resistance training which will not only improve insulin sensitivity but also build muscle mass and reduce risk of atrophy.  Advised importance of portion control and slower eating on this medication to minimize GI side effects. Avoid high fat foods as these may cause discomfort. Patient aware of potential for weight regain when eventually stopping the medication. For this reason, continued efforts toward improving lifestyle will be particularly important moving forward.    Orders: -     Wegovy ; Inject 1 mg into the skin once a week. Use this dose for 1 month (4 shots) and then increase to next higher dose.  Dispense: 2 mL; Refill: 0     Return in about 4 weeks (around 02/26/2024) for OV f/u chronic conditions.    Cody Das, PA-C, DMSc, Nutritionist [facility]  Medicine [facility] [phone]

## 2024-02-08 ENCOUNTER — Encounter: Payer: Self-pay | Admitting: Dermatology

## 2024-02-08 ENCOUNTER — Ambulatory Visit: Admitting: Dermatology

## 2024-02-08 DIAGNOSIS — L732 Hidradenitis suppurativa: Secondary | ICD-10-CM | POA: Diagnosis not present

## 2024-02-08 MED ORDER — CLINDAMYCIN PHOSPHATE 1 % EX LOTN: TOPICAL_LOTION | Freq: Two times a day (BID) | CUTANEOUS | 11 refills | Status: AC

## 2024-02-08 NOTE — Patient Instructions (Addendum)
 Continue clindamycin  1% lotion BID   Recommend Hibiclens OTC wash daily in shower to reduce bacteria being a trigger. Use when showering and apply to area where most active like inner thighs or underarms. Avoid applying to face.   Take Doxycycline  100mg  twice daily as needed flares for 14 days.   Doxycycline  should be taken with food to prevent nausea. Avoid mixing with dairy. Do not lay down for 30 minutes after taking. Be cautious with sun exposure and use good sun protection while on this medication. Pregnant women should not take this medication.    Due to recent changes in healthcare laws, you may see results of your pathology and/or laboratory studies on MyChart before the doctors have had a chance to review them. We understand that in some cases there may be results that are confusing or concerning to you. Please understand that not all results are received at the same time and often the doctors may need to interpret multiple results in order to provide you with the best plan of care or course of treatment. Therefore, we ask that you please give us  2 business days to thoroughly review all your results before contacting the office for clarification. Should we see a critical lab result, you will be contacted sooner.   If You Need Anything After Your Visit  If you have any questions or concerns for your doctor, please call our main line at [phone] and press option 4 to reach your doctor's medical assistant. If no one answers, please leave a voicemail as directed and we will return your call as soon as possible. Messages left after 4 pm will be answered the following business day.   You may also send us  a message via MyChart. We typically respond to MyChart messages within 1-2 business days.  For prescription refills, please ask your pharmacy to contact our office. Our fax number is [phone].  If you have an urgent issue when the clinic is closed that cannot wait until the next  business day, you can page your doctor at the number below.    Please note that while we do our best to be available for urgent issues outside of office hours, we are not available 24/7.   If you have an urgent issue and are unable to reach us , you may choose to seek medical care at your doctor's office, retail clinic, urgent care center, or emergency room.  If you have a medical emergency, please immediately call 911 or go to the emergency department.  Pager Numbers  - Dr. Bary Likes: [phone]  - Dr. Annette Barters: [phone]  - Dr. Felipe Horton: [phone]   In the event of inclement weather, please call our main line at [phone] for an update on the status of any delays or closures.  Dermatology Medication Tips: Please keep the boxes that topical medications come in in order to help keep track of the instructions about where and how to use these. Pharmacies typically print the medication instructions only on the boxes and not directly on the medication tubes.   If your medication is too expensive, please contact our office at [phone] option 4 or send us  a message through MyChart.   We are unable to tell what your co-pay for medications will be in advance as this is different depending on your insurance coverage. However, we may be able to find a substitute medication at lower cost or fill out paperwork to get insurance to cover a needed medication.   If a prior authorization  is required to get your medication covered by your insurance company, please allow us  1-2 business days to complete this process.  Drug prices often vary depending on where the prescription is filled and some pharmacies may offer cheaper prices.  The website www.goodrx.com contains coupons for medications through different pharmacies. The prices here do not account for what the cost may be with help from insurance (it may be cheaper with your insurance), but the website can give you the price if you did not use  any insurance.  - You can print the associated coupon and take it with your prescription to the pharmacy.  - You may also stop by our office during regular business hours and pick up a GoodRx coupon card.  - If you need your prescription sent electronically to a different pharmacy, notify our office through [facility] or by phone at [phone] option 4.     Si Usted Necesita Algo Despus de Su Visita  Tambin puede enviarnos un mensaje a travs de Clinical cytogeneticist. Por lo general respondemos a los mensajes de MyChart en el transcurso de 1 a 2 das hbiles.  Para renovar recetas, por favor pida a su farmacia que se ponga en contacto con nuestra oficina. Franz Jacks de fax es [place] [phone].  Si tiene un asunto urgente cuando la clnica est cerrada y que no puede esperar hasta el siguiente da hbil, puede llamar/localizar a su doctor(a) al nmero que aparece a continuacin.   Por favor, tenga en cuenta que aunque hacemos todo lo posible para estar disponibles para asuntos urgentes fuera del horario de [place], no estamos disponibles las 24 horas del da, los 7 [address]  [address] de la [place].   Si tiene un problema urgente y no puede comunicarse con nosotros, puede optar por buscar atencin mdica  en el consultorio de su doctor(a), en una clnica privada, en un centro de atencin urgente o en una sala de emergencias.  Si tiene Engineer, drilling, por favor llame inmediatamente al 911 o vaya a la sala de emergencias.  Nmeros de bper  - Dr. Bary Likes: [phone]  - Dra. Annette Barters: [phone]  - Dr. Felipe Horton: [phone]   En caso de inclemencias del tiempo, por favor llame a Lajuan Pila principal al [phone] para una actualizacin sobre el [place] de cualquier retraso o cierre.  Consejos para la medicacin en dermatologa: Por favor, guarde las cajas en las que vienen los medicamentos de uso tpico para ayudarle a seguir las instrucciones sobre dnde y cmo usarlos. Las farmacias  generalmente imprimen las instrucciones del medicamento slo en las cajas y no directamente en los tubos del [place].   Si su medicamento es muy caro, por favor, pngase en contacto con Bettyjane Brunet llamando al [phone] y presione la opcin 4 o envenos un mensaje a travs de Clinical cytogeneticist.   No podemos decirle cul ser su copago por los medicamentos por adelantado ya que esto es diferente dependiendo de la cobertura de su seguro. Sin embargo, es posible que podamos encontrar un medicamento sustituto a Audiological scientist un formulario para que el seguro cubra el medicamento que se considera necesario.   Si se requiere una autorizacin previa para que su compaa de seguros [place] su medicamento, por favor permtanos de 1 a 2 das hbiles para completar este proceso.  Los precios de los medicamentos varan con frecuencia dependiendo del Environmental consultant de dnde se surte la receta y alguna farmacias pueden ofrecer precios ms baratos.  El sitio web www.goodrx.com tiene cupones para  medicamentos de [employer]. Los precios aqu no tienen en cuenta lo que podra costar con la ayuda del seguro (puede ser ms barato con su seguro), pero el sitio web puede darle el precio si no utiliz Tourist information centre manager.  - Puede imprimir el cupn correspondiente y llevarlo con su receta a la farmacia.  - Tambin puede pasar por nuestra oficina durante el horario de atencin regular y Education officer, museum una tarjeta de cupones de GoodRx.  - Si necesita que su receta se enve electrnicamente a una farmacia diferente, informe a nuestra oficina a travs de MyChart de [place] o por telfono llamando al [phone] y presione la opcin 4.

## 2024-02-08 NOTE — Progress Notes (Signed)
   Follow-Up Visit   Subjective  Sheila Gilbert is a 33 y.o. female who presents for the following: 3 month HS follow up. Patient currently using clindamycin  daily, has only had 1 flare since initial visit. She does have a rx for doxycycline  to use for flares but has not. No active areas today. Patient also working with PCP to get A1c under control.   The patient has spots, moles and lesions to be evaluated, some may be new or changing and the patient may have concern these could be cancer.   The following portions of the chart were reviewed this encounter and updated as appropriate: medications, allergies, medical history  Review of Systems:  No other skin or systemic complaints except as noted in HPI or Assessment and Plan.  Objective  Well appearing patient in no apparent distress; mood and affect are within normal limits.   A focused examination was performed of the following areas: thighs  Relevant exam findings are noted in the Assessment and Plan.    Assessment & Plan   HIDRADENITIS SUPPURATIVA Exam: no active lesions on clothed exam of thighs  Chronic condition with duration or expected duration over one year. Currently well-controlled.  Hidradenitis Suppurativa is a chronic; persistent; non-curable, but treatable condition due to abnormal inflamed sweat glands in the body folds (axilla, inframammary, groin, medial thighs), causing recurrent painful draining cysts and scarring. It can be associated with severe scarring acne and cysts; also abscesses and scarring of scalp. The goal is control and prevention of flares, as it is not curable. Scars are permanent and can be thickened. Treatment may include daily use of topical medication and oral antibiotics.  Oral isotretinoin may also be helpful.  For some cases, Humira or Cosentyx (biologic injections) may be prescribed to decrease the inflammatory process and prevent flares.  When indicated, inflamed cysts may also be treated  surgically.  Treatment Plan: Continue clindamycin  1% lotion BID   Recommend Hibiclens OTC wash daily in shower to reduce bacteria being a trigger. Use when showering and apply to area where most active like inner thighs or underarms. Avoid applying to face.   Patient has a rx of doxycycline  100 mg to take BID prn flares x 14 days.  Contact us  if flaring   HIDRADENITIS SUPPURATIVA    Return in about 1 year (around 02/07/2025) for Hidradenitis.  Kerstin Peeling, RMA, am acting as scribe for Harris Liming, MD .   Documentation: I have reviewed the above documentation for accuracy and completeness, and I agree with the above.  Harris Liming, MD

## 2024-02-24 ENCOUNTER — Ambulatory Visit: Admitting: Physician Assistant

## 2024-02-24 DIAGNOSIS — E66811 Obesity, class 1: Secondary | ICD-10-CM | POA: Diagnosis not present

## 2024-02-24 DIAGNOSIS — Z6834 Body mass index (BMI) 34.0-34.9, adult: Secondary | ICD-10-CM | POA: Diagnosis not present

## 2024-02-24 MED ORDER — WEGOVY 1 MG/0.5ML ~~LOC~~ SOAJ: 1.0000 mg | SUBCUTANEOUS | 0 refills | Status: DC

## 2024-02-24 NOTE — Assessment & Plan Note (Signed)
 Patient continues to do exceedingly well with Wegovy .  Experiencing some nausea recently, but wants to see if this improves over the next month.  Will refill at the 1 mg dose with follow-up in 1 month.  Advised the importance of adequate protein intake on this medication as well as resistance training which will not only improve insulin sensitivity but also build muscle mass and reduce risk of atrophy.  Advised importance of portion control and slower eating on this medication to minimize GI side effects. Avoid high fat foods as these may cause discomfort. Patient aware of potential for weight regain when eventually stopping the medication. For this reason, continued efforts toward improving lifestyle will be particularly important moving forward.

## 2024-02-24 NOTE — Progress Notes (Signed)
 Date:  02/24/2024   Name:  Sheila Gilbert   DOB:  Sep 24, 1990   MRN:  [id number]   Chief Complaint: Medical Management of Chronic Issues (Down 5 lbs/)  HPI Sheila Gilbert returns for 1 month follow-up on weight management following dose increase of Wegovy  last visit, presently at the 1 mg/week dose, down an additional 5 lb since last visit according to our scale. She has lost over 45 pounds since starting the medication Nov 2024. She continues with dedicated strength training and her husband is working out with her as well; she goes 3x per week and does 30 minutes cardio and 30 minutes strength. She also goes on walks at least twice per week. Overall she is quite satisfied with her progress. Since she started running again, her anxiety and depressive symptoms have improved and she is no longer using buspirone.  She is currently participating in a month-long challenge to walk 3 miles every day to raise awareness for leukemia and lymphoma.  She does report some slowing of GI motility since the dose increase to 1 mg.  Also endorses nausea particularly in the 24 to 48 hours following Wegovy  administration.  That said, she is not interested in adding any antinausea medication, and wants to continue with the current dose to see if this side effect resolves in time.  Unrelated, she had a recent visit with dermatology 02/08/24 for follow-up on HS and I have reviewed these notes.  Seems to be well-controlled with topical clindamycin  1-2 times daily and Hibiclens shower wash, has doxy if needed for flare.  Will be seeing them again in 1 year.    Medication list has been reviewed and updated.  Current Meds  Medication Sig   albuterol  (VENTOLIN  HFA) 108 (90 Base) MCG/ACT inhaler Inhale into the lungs.   clindamycin  (CLEOCIN -T) 1 % lotion Apply topically 2 (two) times daily. Apply topically BID prn   dicyclomine  (BENTYL ) 10 MG capsule Take 1 capsule (10 mg total) by mouth 3 (three) times daily as needed for spasms.    etonogestrel -ethinyl estradiol  (NUVARING) 0.12-0.015 MG/24HR vaginal ring Insert vaginally and leave in place for 3 consecutive weeks, then remove for 1 week.   [DISCONTINUED] busPIRone (BUSPAR) 5 MG tablet Take 5 mg by mouth 2 (two) times daily.   [DISCONTINUED] WEGOVY  1 MG/0.5ML SOAJ Inject 1 mg into the skin once a week. Use this dose for 1 month (4 shots) and then increase to next higher dose.     Review of Systems  Patient Active Problem List   Diagnosis Date Noted   Generalized anxiety disorder 12/04/2023   Moderate episode of recurrent major depressive disorder (HCC) 12/04/2023   Mild hyperlipidemia 08/03/2023   Metabolic syndrome 08/03/2023   Class 1 obesity with serious comorbidity in adult 07/31/2023   Prediabetes 07/31/2023   Family history of breast cancer 10/30/2022   Family history of ovarian cancer 10/30/2022   Hidradenitis suppurativa 04/09/2016    Allergies  Allergen Reactions   Bee Pollen    Dust Mite Extract    Latex Swelling    Immunization History  Administered Date(s) Administered   Influenza, Seasonal, Injecte, Preservative Fre 07/31/2023   PFIZER(Purple Top)SARS-COV-2 Vaccination 02/09/2020, 03/02/2020    Past Surgical History:  Procedure Laterality Date   NO PAST SURGERIES      Social History   Tobacco Use   Smoking status: Never   Smokeless tobacco: Never  Vaping Use   Vaping status: Never Used  Substance Use Topics   Alcohol  use: Not Currently   Drug use: No    Family History  Problem Relation Age of Onset   Hypertension Mother    Diabetes Mother    Hypertension Father    Heart disease Father    Diabetes Father    Breast cancer Maternal Aunt 30   Breast cancer Maternal Grandmother 27   Ovarian cancer Maternal Grandmother        50-60s        02/24/2024    8:05 AM 01/29/2024   10:03 AM 01/04/2024    9:08 AM 12/04/2023    9:31 AM  GAD 7 : Generalized Anxiety Score  Nervous, Anxious, on Edge 1 2 3 3   Control/stop worrying 1 2  2 3   Worry too much - different things 1 2 2 3   Trouble relaxing 2  2 3   Restless 2 1 1 1   Easily annoyed or irritable 1 2 2 2   Afraid - awful might happen 1 2 2 2   Total GAD 7 Score 9  14 17   Anxiety Difficulty Somewhat difficult Somewhat difficult Somewhat difficult        02/24/2024    8:05 AM 01/29/2024   10:02 AM 01/04/2024    9:08 AM  Depression screen PHQ 2/9  Decreased Interest 1 1 2   Down, Depressed, Hopeless 1 1 2   PHQ - 2 Score 2 2 4   Altered sleeping 1 0 2  Tired, decreased energy 1 2 3   Change in appetite 1 0 0  Feeling bad or failure about yourself  0 0 1  Trouble concentrating 2 2 2   Moving slowly or fidgety/restless 2 0 1  Suicidal thoughts 0 0 0  PHQ-9 Score 9 6 13   Difficult doing work/chores  Not difficult at all Not difficult at all    BP Readings from Last 3 Encounters:  02/24/24 114/76  01/29/24 126/84  01/04/24 132/84    Wt Readings from Last 3 Encounters:  02/24/24 239 lb (108.4 kg)  01/29/24 244 lb (110.7 kg)  01/04/24 245 lb (111.1 kg)    BP 114/76   Pulse 86   Temp 98.5 F (36.9 C)   Ht 5' 10 (1.778 m)   Wt 239 lb (108.4 kg)   SpO2 96%   BMI 34.29 kg/m   Physical Exam Vitals and nursing note reviewed.  Constitutional:      Appearance: Normal appearance.   Cardiovascular:     Rate and Rhythm: Normal rate.  Pulmonary:     Effort: Pulmonary effort is normal.  Abdominal:     General: There is no distension.   Musculoskeletal:        General: Normal range of motion.   Skin:    General: Skin is warm and dry.   Neurological:     Mental Status: She is alert and oriented to person, place, and time.     Gait: Gait is intact.   Psychiatric:        Mood and Affect: Mood and affect normal.     Recent Labs     Component Value Date/Time   NA 139 12/08/2023 1728   NA 143 07/31/2023 1031   K 3.8 12/08/2023 1728   CL 108 12/08/2023 1728   CO2 21 (L) 12/08/2023 1728   GLUCOSE 87 12/08/2023 1728   BUN 12 12/08/2023 1728    BUN 8 07/31/2023 1031   CREATININE 0.61 12/08/2023 1728   CALCIUM 9.0 12/08/2023 1728   PROT 7.4 12/08/2023 1728   PROT  7.1 11/06/2023 1508   ALBUMIN 3.9 12/08/2023 1728   ALBUMIN 4.6 11/06/2023 1508   AST 26 12/08/2023 1728   ALT 46 (H) 12/08/2023 1728   ALKPHOS 48 12/08/2023 1728   BILITOT 0.6 12/08/2023 1728   BILITOT 0.3 11/06/2023 1508   GFRNONAA >60 12/08/2023 1728   GFRAA >60 10/06/2017 1944    Lab Results  Component Value Date   WBC 9.2 12/08/2023   HGB 13.6 12/08/2023   HCT 41.4 12/08/2023   MCV 88.5 12/08/2023   PLT 328 12/08/2023   Lab Results  Component Value Date   HGBA1C 5.9 (H) 11/06/2023   Lab Results  Component Value Date   CHOL 198 11/06/2023   HDL 38 (L) 11/06/2023   LDLCALC 134 (H) 11/06/2023   TRIG 146 11/06/2023   CHOLHDL 5.2 (H) 11/06/2023   Lab Results  Component Value Date   TSH 1.500 07/31/2023     Assessment and Plan:  Class 1 obesity with serious comorbidity and body mass index (BMI) of 34.0 to 34.9 in adult, unspecified obesity type Assessment & Plan: Patient continues to do exceedingly well with Wegovy .  Experiencing some nausea recently, but wants to see if this improves over the next month.  Will refill at the 1 mg dose with follow-up in 1 month.  Advised the importance of adequate protein intake on this medication as well as resistance training which will not only improve insulin sensitivity but also build muscle mass and reduce risk of atrophy.  Advised importance of portion control and slower eating on this medication to minimize GI side effects. Avoid high fat foods as these may cause discomfort. Patient aware of potential for weight regain when eventually stopping the medication. For this reason, continued efforts toward improving lifestyle will be particularly important moving forward.    Orders: -     Wegovy ; Inject 1 mg into the skin once a week. Use this dose for 1 month (4 shots) and then increase to next higher dose.   Dispense: 2 mL; Refill: 0     Return in about 4 weeks (around 03/23/2024) for OV f/u chronic conditions.    Cody Das, PA-C, DMSc, Nutritionist [facility] [phone]

## 2024-02-26 ENCOUNTER — Ambulatory Visit: Admitting: Physician Assistant

## 2024-03-15 ENCOUNTER — Other Ambulatory Visit: Payer: Self-pay | Admitting: Physician Assistant

## 2024-03-15 DIAGNOSIS — Z6834 Body mass index (BMI) 34.0-34.9, adult: Secondary | ICD-10-CM

## 2024-03-15 DIAGNOSIS — R110 Nausea: Secondary | ICD-10-CM

## 2024-03-15 MED ORDER — METOCLOPRAMIDE HCL 10 MG PO TABS
10.0000 mg | ORAL_TABLET | Freq: Four times a day (QID) | ORAL | 0 refills | Status: AC | PRN
Start: 2024-03-15 — End: ?

## 2024-03-15 MED ORDER — WEGOVY 0.5 MG/0.5ML ~~LOC~~ SOAJ: 0.5000 mg | SUBCUTANEOUS | 1 refills | Status: DC

## 2024-03-15 NOTE — Telephone Encounter (Signed)
 Please review. Appt?  KP

## 2024-03-25 ENCOUNTER — Ambulatory Visit: Admitting: Physician Assistant

## 2024-03-25 ENCOUNTER — Encounter: Payer: Self-pay | Admitting: Physician Assistant

## 2024-04-08 ENCOUNTER — Encounter: Payer: Self-pay | Admitting: Physician Assistant

## 2024-04-08 ENCOUNTER — Telehealth: Admitting: Physician Assistant

## 2024-04-08 VITALS — Ht 70.0 in | Wt 232.0 lb

## 2024-04-08 DIAGNOSIS — Z6833 Body mass index (BMI) 33.0-33.9, adult: Secondary | ICD-10-CM | POA: Diagnosis not present

## 2024-04-08 DIAGNOSIS — Z713 Dietary counseling and surveillance: Secondary | ICD-10-CM | POA: Diagnosis not present

## 2024-04-08 DIAGNOSIS — E66811 Obesity, class 1: Secondary | ICD-10-CM

## 2024-04-08 MED ORDER — WEGOVY 0.5 MG/0.5ML ~~LOC~~ SOAJ: 0.5000 mg | SUBCUTANEOUS | 0 refills | Status: DC

## 2024-04-08 NOTE — Progress Notes (Signed)
 Date:  04/08/2024   Name:  Sheila Gilbert   DOB:  08-08-1991   MRN:  [id number]   I connected with Sheila Gilbert on 04/08/24 via MyChart Video and verified that I am speaking with the correct person using appropriate identifiers. The limitations, risks, security and privacy concerns of performing an evaluation and management service by MyChart Video, including the higher likelihood of inaccurate diagnoses and treatments, and the availability of in person appointments were reviewed. The possible need of an additional face-to-face encounter for complete and high quality delivery of care was discussed. The patient was also made aware that there may be a patient responsible charge related to this service. The patient expressed understanding and wishes to proceed.   Provider location is in medical facility [facility] at [facility]). Patient location is at their home People involved in care of the patient during this telehealth encounter were myself, my CMA, and my front office/scheduling team member.    Chief Complaint: Anxiety and Weight Check (Does know if she wants to stay on 0.5 or move to 1MG ,  just having the normal side effects that happen)  HPI Sheila Gilbert presents virtually for 1 month follow-up on weight management following dose decrease of Wegovy  from 1 mg to 0.5 mg shortly following our last visit due to concerns for gastroparesis-like syndrome (see msgs). She also notes that she had restarted her buspirone due to anxiety at that time, unsure if perhaps that was the cause of her nausea and vomiting. Continues to lose weight even at the lower dose, down about 5-7 lbs by her report. She has lost over 50 pounds since starting the medication Nov 2024.   Following a period of instability with her recent move, she has restarted dedicated strength training and her husband is working out with her as well; goal of 3x per week and does 30 minutes cardio and 30  minutes strength. She also goes on walks at least twice per week. Overall she is quite satisfied with her progress. Anxiety and depressive symptoms historically respond well to physical activity.    Medication list has been reviewed and updated.  Current Meds  Medication Sig   albuterol  (VENTOLIN  HFA) 108 (90 Base) MCG/ACT inhaler Inhale into the lungs.   busPIRone (BUSPAR) 5 MG tablet Take 5 mg by mouth 3 (three) times daily as needed.   clindamycin  (CLEOCIN -T) 1 % lotion Apply topically 2 (two) times daily. Apply topically BID prn   dicyclomine  (BENTYL ) 10 MG capsule Take 1 capsule (10 mg total) by mouth 3 (three) times daily as needed for spasms.   etonogestrel -ethinyl estradiol  (NUVARING) 0.12-0.015 MG/24HR vaginal ring Insert vaginally and leave in place for 3 consecutive weeks, then remove for 1 week.   metoCLOPramide  (REGLAN ) 10 MG tablet Take 1 tablet (10 mg total) by mouth every 6 (six) hours as needed for nausea or vomiting.   WEGOVY  0.5 MG/0.5ML SOAJ Inject 0.5 mg into the skin once a week.   [DISCONTINUED] WEGOVY  0.5 MG/0.5ML SOAJ Inject 0.5 mg into the skin once a week.     Review of Systems  Patient Active Problem List   Diagnosis Date Noted   Generalized anxiety disorder 12/04/2023   Moderate episode of recurrent major depressive disorder (HCC) 12/04/2023   Mild hyperlipidemia 08/03/2023   Metabolic syndrome 08/03/2023   Class 1 obesity with serious comorbidity in adult 07/31/2023   Prediabetes 07/31/2023   Family history of breast cancer 10/30/2022   Family  history of ovarian cancer 10/30/2022   Hidradenitis suppurativa 04/09/2016    Allergies  Allergen Reactions   Bee Pollen    Dust Mite Extract    Latex Swelling    Immunization History  Administered Date(s) Administered   Influenza, Seasonal, Injecte, Preservative Fre 07/31/2023   PFIZER(Purple Top)SARS-COV-2 Vaccination 02/09/2020, 03/02/2020    Past Surgical History:  Procedure Laterality Date   NO  PAST SURGERIES      Social History   Tobacco Use   Smoking status: Never   Smokeless tobacco: Never  Vaping Use   Vaping status: Never Used  Substance Use Topics   Alcohol use: Not Currently   Drug use: No    Family History  Problem Relation Age of Onset   Hypertension Mother    Diabetes Mother    Hypertension Father    Heart disease Father    Diabetes Father    Breast cancer Maternal Aunt 30   Breast cancer Maternal Grandmother 50   Ovarian cancer Maternal Grandmother        50-60s        04/08/2024   10:49 AM 02/24/2024    8:05 AM 01/29/2024   10:03 AM 01/04/2024    9:08 AM  GAD 7 : Generalized Anxiety Score  Nervous, Anxious, on Edge 1 1 2 3   Control/stop worrying 1 1 2 2   Worry too much - different things 1 1 2 2   Trouble relaxing 2 2  2   Restless 2 2 1 1   Easily annoyed or irritable 1 1 2 2   Afraid - awful might happen 1 1 2 2   Total GAD 7 Score 9 9  14   Anxiety Difficulty  Somewhat difficult Somewhat difficult Somewhat difficult       04/08/2024   10:49 AM 02/24/2024    8:05 AM 01/29/2024   10:02 AM  Depression screen PHQ 2/9  Decreased Interest 0 1 1  Down, Depressed, Hopeless 0 1 1  PHQ - 2 Score 0 2 2  Altered sleeping 0 1 0  Tired, decreased energy 0 1 2  Change in appetite 0 1 0  Feeling bad or failure about yourself  0 0 0  Trouble concentrating 0 2 2  Moving slowly or fidgety/restless 0 2 0  Suicidal thoughts 0 0 0  PHQ-9 Score 0 9 6  Difficult doing work/chores   Not difficult at all    BP Readings from Last 3 Encounters:  02/24/24 114/76  01/29/24 126/84  01/04/24 132/84    Wt Readings from Last 3 Encounters:  04/08/24 232 lb (105.2 kg)  02/24/24 239 lb (108.4 kg)  01/29/24 244 lb (110.7 kg)    Ht 5' 10 (1.778 m)   Wt 232 lb (105.2 kg) Comment: self-reported  BMI 33.29 kg/m   Physical Exam General: Speaking full sentences, no audible heavy breathing. Sounds alert and appropriately interactive. Well-appearing. Face symmetric.  Extraocular movements intact. Pupils equal and round. No nasal flaring or accessory muscle use visualized.  Recent Labs     Component Value Date/Time   NA 139 12/08/2023 1728   NA 143 07/31/2023 1031   K 3.8 12/08/2023 1728   CL 108 12/08/2023 1728   CO2 21 (L) 12/08/2023 1728   GLUCOSE 87 12/08/2023 1728   BUN 12 12/08/2023 1728   BUN 8 07/31/2023 1031   CREATININE 0.61 12/08/2023 1728   CALCIUM 9.0 12/08/2023 1728   PROT 7.4 12/08/2023 1728   PROT 7.1 11/06/2023 1508   ALBUMIN 3.9  12/08/2023 1728   ALBUMIN 4.6 11/06/2023 1508   AST 26 12/08/2023 1728   ALT 46 (H) 12/08/2023 1728   ALKPHOS 48 12/08/2023 1728   BILITOT 0.6 12/08/2023 1728   BILITOT 0.3 11/06/2023 1508   GFRNONAA >60 12/08/2023 1728   GFRAA >60 10/06/2017 1944    Lab Results  Component Value Date   WBC 9.2 12/08/2023   HGB 13.6 12/08/2023   HCT 41.4 12/08/2023   MCV 88.5 12/08/2023   PLT 328 12/08/2023   Lab Results  Component Value Date   HGBA1C 5.9 (H) 11/06/2023   Lab Results  Component Value Date   CHOL 198 11/06/2023   HDL 38 (L) 11/06/2023   LDLCALC 134 (H) 11/06/2023   TRIG 146 11/06/2023   CHOLHDL 5.2 (H) 11/06/2023   Lab Results  Component Value Date   TSH 1.500 07/31/2023     Assessment and Plan:  1. Class 1 obesity with serious comorbidity and body mass index (BMI) of 33.0 to 33.9 in adult, unspecified obesity type (Primary) Continue current dose of Wegovy  for now. Will proceed cautiously with any potential dose increase in the future.   Advised the importance of adequate protein intake on this medication as well as resistance training which will not only improve insulin sensitivity but also build muscle mass and reduce risk of atrophy.  Advised importance of portion control and slower eating on this medication to minimize GI side effects. Avoid high fat foods as these may cause discomfort. Patient aware of potential for weight regain when eventually stopping the medication. For  this reason, continued efforts toward improving lifestyle will be particularly important moving forward.   - WEGOVY  0.5 MG/0.5ML SOAJ; Inject 0.5 mg into the skin once a week.  Dispense: 2 mL; Refill: 0    1 month f/u in-person. Patient to schedule via MyChart.   I discussed the above assessment and treatment plan with the patient. The patient was provided an opportunity to ask questions and all were answered. The patient agreed with the plan and demonstrated an understanding of the instructions. The patient was advised to call back or seek an in-person evaluation if the symptoms worsen or if the condition fails to improve as anticipated. I provided a total time of 15 minutes inclusive of time utilized for medical chart review, information gathering, care coordination with staff, and documentation completion.  Rolan Hoyle, PA-C, DMSc, Nutritionist [facility] [phone]

## 2024-04-08 NOTE — Telephone Encounter (Signed)
 Noted  KP

## 2024-04-24 DIAGNOSIS — Z203 Contact with and (suspected) exposure to rabies: Secondary | ICD-10-CM | POA: Diagnosis not present

## 2024-04-24 DIAGNOSIS — S80271A Other superficial bite of right knee, initial encounter: Secondary | ICD-10-CM | POA: Diagnosis not present

## 2024-04-24 DIAGNOSIS — E119 Type 2 diabetes mellitus without complications: Secondary | ICD-10-CM | POA: Diagnosis not present

## 2024-04-24 DIAGNOSIS — W540XXA Bitten by dog, initial encounter: Secondary | ICD-10-CM | POA: Diagnosis not present

## 2024-04-24 DIAGNOSIS — Z91040 Latex allergy status: Secondary | ICD-10-CM | POA: Diagnosis not present

## 2024-04-24 DIAGNOSIS — Y990 Civilian activity done for income or pay: Secondary | ICD-10-CM | POA: Diagnosis not present

## 2024-04-24 DIAGNOSIS — Z2914 Encounter for prophylactic rabies immune globin: Secondary | ICD-10-CM | POA: Diagnosis not present

## 2024-04-24 DIAGNOSIS — S81851A Open bite, right lower leg, initial encounter: Secondary | ICD-10-CM | POA: Diagnosis not present

## 2024-04-24 DIAGNOSIS — Z23 Encounter for immunization: Secondary | ICD-10-CM | POA: Diagnosis not present

## 2024-04-25 ENCOUNTER — Telehealth: Payer: Self-pay

## 2024-04-25 ENCOUNTER — Telehealth: Admitting: Physician Assistant

## 2024-04-25 ENCOUNTER — Encounter: Payer: Self-pay | Admitting: Physician Assistant

## 2024-04-25 VITALS — Ht 70.0 in

## 2024-04-25 DIAGNOSIS — W540XXD Bitten by dog, subsequent encounter: Secondary | ICD-10-CM | POA: Diagnosis not present

## 2024-04-25 DIAGNOSIS — S81051D Open bite, right knee, subsequent encounter: Secondary | ICD-10-CM

## 2024-04-25 NOTE — Transitions of Care (Post Inpatient/ED Visit) (Signed)
   04/25/2024  Name: Sheila Gilbert MRN: [id number] DOB: 1990/10/29  Today's TOC FU Call Status: Today's TOC FU Call Status:: Successful TOC FU Call Completed Unsuccessful Call (1st Attempt) Date: 04/25/24 [facility] FU Call Complete Date: 04/25/24 Patient's Name and Date of Birth confirmed.  Transition Care Management Follow-up Telephone Call Date of Discharge: 04/24/24 Discharge Facility: Other [facility]) Name of Other (Non[facility]) Discharge Facility: [facility] Type of Discharge: Emergency Department Reason for ED Visit: Other: (Open bite, right lower leg) How have you been since you were released from the hospital?: Same Any questions or concerns?: No  Items Reviewed: Did you receive and understand the discharge instructions provided?: Yes Medications obtained,verified, and reconciled?: Yes (Medications Reviewed) Any new allergies since your discharge?: No Dietary orders reviewed?: NA Do you have support at home?: Yes  Medications Reviewed Today: Medications Reviewed Today     Reviewed by Elmo Shumard, Steva SAILOR, CMA (Certified Medical Assistant) on 04/25/24 at 1151  Med List Status: <None>   Medication Order Taking? Sig Documenting Provider Last Dose Status Informant  albuterol  (VENTOLIN  HFA) 108 (90 Base) MCG/ACT inhaler [id number]  Inhale into the lungs. [provider]  Active   amoxicillin-clavulanate (AUGMENTIN) 875-125 MG tablet [id number]  Take 1 tablet by mouth 2 (two) times daily. [provider]  Active   busPIRone (BUSPAR) 5 MG tablet [id number]  Take 5 mg by mouth 3 (three) times daily as needed. [provider]  Active   clindamycin  (CLEOCIN -T) 1 % lotion [id number]  Apply topically 2 (two) times daily. Apply topically BID prn Smith, Collin-Jamal, MD  Active   dicyclomine  (BENTYL ) 10 MG capsule [id number]  Take 1 capsule (10 mg total) by mouth 3 (three) times daily as needed for spasms. Dorothyann Drivers, MD  Active    etonogestrel -ethinyl estradiol  (NUVARING) 0.12-0.015 MG/24HR vaginal ring [id number]  Insert vaginally and leave in place for 3 consecutive weeks, then remove for 1 week. Copland, Alicia B, PA-C  Active   metoCLOPramide  (REGLAN ) 10 MG tablet [id number]  Take 1 tablet (10 mg total) by mouth every 6 (six) hours as needed for nausea or vomiting. Manya Toribio SQUIBB, PA  Active   WEGOVY  0.5 MG/0.5ML EMMANUEL [id number]  Inject 0.5 mg into the skin once a week. Manya Toribio SQUIBB, PA  Active             Home Care and Equipment/Supplies: Were Home Health Services Ordered?: NA Any new equipment or medical supplies ordered?: NA  Functional Questionnaire: Do you need assistance with bathing/showering or dressing?: No Do you need assistance with meal preparation?: No Do you need assistance with eating?: No Do you have difficulty maintaining continence: No Do you need assistance with getting out of bed/getting out of a chair/moving?: No Do you have difficulty managing or taking your medications?: No  Follow up appointments reviewed:      SIGNATUREKieandra Paetyn Pietrzak, CMA

## 2024-04-25 NOTE — Progress Notes (Signed)
 Date:  04/25/2024   Name:  Sheila Gilbert   DOB:  [birth year]   MRN:  [id number]   I connected with Leeroy Deiters on 04/25/24 via MyChart Video and verified that I am speaking with the correct Sheila Gilbert using appropriate identifiers. The limitations, risks, security and privacy concerns of performing an evaluation and management service by MyChart Video, including the higher likelihood of inaccurate diagnoses and treatments, and the availability of in [facility] appointments were reviewed. The possible need of an additional face-to-face encounter for complete and high quality delivery of care was discussed. The patient was also made aware that there may be a patient responsible charge related to this service. The patient expressed understanding and wishes to proceed.   Provider location is in medical facility [facility] Primary [facility] and Sports Medicine at [facility]). Patient location is at their home People involved in care of the patient during this telehealth encounter were myself, my CMA, and my front office/scheduling team member.    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 She says the dog escaped its kennel, exhibited aggressive behavior, and the bite broke the skin through her jeans. She was given Tdap, started on Augmentin for the next 14 days, and also began treatment for rabies with attention to follow up on day 3, 7, and 14. She will be going to urgent care for the remainder of the series. Was advised to ask me if okay to continue Wegovy  since she usually gives this in her thigh.    Medication list  has been reviewed and updated.  Current Meds  Medication Sig   albuterol  (VENTOLIN  HFA) 108 (90 Base) MCG/ACT inhaler Inhale into the lungs.   amoxicillin-clavulanate (AUGMENTIN) 875-125 MG tablet Take 1 tablet by mouth 2 (two) times daily.   busPIRone (BUSPAR) 5 MG tablet Take 5 mg by mouth 3 (three) times daily as needed.   clindamycin  (CLEOCIN -T) 1 % lotion Apply topically 2 (two) times daily. Apply topically BID prn   dicyclomine  (BENTYL ) 10 MG capsule Take 1 capsule (10 mg total) by mouth 3 (three) times daily as needed for spasms.   etonogestrel -ethinyl estradiol  (NUVARING) 0.12-0.015 MG/24HR vaginal ring Insert vaginally and leave in place for 3 consecutive weeks, then remove for 1 week.   metoCLOPramide  (REGLAN ) 10 MG tablet Take 1 tablet (10 mg total) by mouth every 6 (six) hours as needed for nausea or vomiting.   WEGOVY  0.5 MG/0.5ML SOAJ Inject 0.5 mg into the skin once a week.     Review of Systems  Patient Active Problem List   Diagnosis Date Noted   Generalized anxiety disorder 12/04/2023   Moderate episode of recurrent major depressive disorder (HCC) 12/04/2023   Mild hyperlipidemia 08/03/2023   Metabolic syndrome 08/03/2023   Class 1 obesity with serious comorbidity in adult 07/31/2023   Prediabetes 07/31/2023   Family history of breast cancer 10/30/2022   Family history of ovarian cancer 10/30/2022   Hidradenitis suppurativa 04/09/2016    Allergies  Allergen Reactions   Bee Pollen Other (See Comments)   Dust Mite Extract  Other (See Comments)   Latex Swelling    Immunization History  Administered Date(s) Administered   Influenza, Seasonal, Injecte, Preservative Fre 07/31/2023   PFIZER(Purple Top)SARS-COV-2 Vaccination 02/09/2020, 03/02/2020   Rabies, IM 04/24/2024   Tdap 04/24/2024    Past Surgical History:  Procedure Laterality Date   NO PAST SURGERIES      Social History   Tobacco Use   Smoking status: Never   Smokeless tobacco: Never  Vaping  Use   Vaping status: Never Used  Substance Use Topics   Alcohol use: Not Currently   Drug use: No    Family History  Problem Relation Age of Onset   Hypertension Mother    Diabetes Mother    Hypertension Father    Heart disease Father    Diabetes Father    Breast cancer Maternal Aunt 30   Breast cancer Maternal Grandmother 50   Ovarian cancer Maternal Grandmother        50-60s        04/08/2024   10:49 AM 02/24/2024    8:05 AM 01/29/2024   10:03 AM 01/04/2024    9:08 AM  GAD 7 : Generalized Anxiety Score  Nervous, Anxious, on Edge 1 1 2 3   Control/stop worrying 1 1 2 2   Worry too much - different things 1 1 2 2   Trouble relaxing 2 2  2   Restless 2 2 1 1   Easily annoyed or irritable 1 1 2 2   Afraid - awful might happen 1 1 2 2   Total GAD 7 Score 9 9  14   Anxiety Difficulty  Somewhat difficult Somewhat difficult Somewhat difficult       04/08/2024   10:49 AM 02/24/2024    8:05 AM 01/29/2024   10:02 AM  Depression screen PHQ 2/9  Decreased Interest 0 1 1  Down, Depressed, Hopeless 0 1 1  PHQ - 2 Score 0 2 2  Altered sleeping 0 1 0  Tired, decreased energy 0 1 2  Change in appetite 0 1 0  Feeling bad or failure about yourself  0 0 0  Trouble concentrating 0 2 2  Moving slowly or fidgety/restless 0 2 0  Suicidal thoughts 0 0 0  PHQ-9 Score 0 9 6  Difficult doing work/chores   Not difficult at all    BP Readings from Last 3 Encounters:  02/24/24 114/76  01/29/24 126/84  01/04/24 132/84    Wt Readings from Last 3 Encounters:  04/08/24 232 lb (105.2 kg)  02/24/24 239 lb (108.4 kg)  01/29/24 244 lb (110.7 kg)    Ht 5' 10 (1.778 m)   BMI 33.29 kg/m   Physical Exam General: Speaking full sentences, no audible heavy breathing. Sounds alert and appropriately interactive. Well-appearing. Face symmetric. Extraocular movements intact. Pupils equal and round. No nasal flaring or accessory muscle use visualized.  Recent Labs     Component Value Date/Time   NA 139  12/08/2023 1728   NA 143 07/31/2023 1031   K 3.8 12/08/2023 1728   CL 108 12/08/2023 1728   CO2 21 (L) 12/08/2023 1728   GLUCOSE 87 12/08/2023 1728   BUN 12 12/08/2023 1728   BUN 8 07/31/2023 1031   CREATININE 0.61 12/08/2023 1728   CALCIUM 9.0 12/08/2023 1728   PROT 7.4 12/08/2023 1728   PROT 7.1 11/06/2023 1508   ALBUMIN 3.9 12/08/2023 1728   ALBUMIN 4.6 11/06/2023 1508   AST 26 12/08/2023 1728   ALT 46 (H) 12/08/2023 1728   ALKPHOS  48 12/08/2023 1728   BILITOT 0.6 12/08/2023 1728   BILITOT 0.3 11/06/2023 1508   GFRNONAA >60 12/08/2023 1728   GFRAA >60 10/06/2017 1944    Lab Results  Component Value Date   WBC 9.2 12/08/2023   HGB 13.6 12/08/2023   HCT 41.4 12/08/2023   MCV 88.5 12/08/2023   PLT 328 12/08/2023   Lab Results  Component Value Date   HGBA1C 5.9 (H) 11/06/2023   Lab Results  Component Value Date   CHOL 198 11/06/2023   HDL 38 (L) 11/06/2023   LDLCALC 134 (H) 11/06/2023   TRIG 146 11/06/2023   CHOLHDL 5.2 (H) 11/06/2023   Lab Results  Component Value Date   TSH 1.500 07/31/2023     Assessment and Plan:  1. Dog bite, subsequent encounter (Primary) Agree with treatment plan. Okay to continue Wegovy  regardless of site of administration, though she might consider administering in the abdomen. Advised to monitor for signs of intra-articular infection which I feel is unlikely. Reviewed the rarity of rabies in dogs, but the importance of treatment anyway since there is no cure once the disease is contracted.    F/u as scheduled  I discussed the above assessment and treatment plan with the patient. The patient was provided an opportunity to ask questions and all were answered. The patient agreed with the plan and demonstrated an understanding of the instructions. The patient was advised to call back or seek an in-[place] evaluation if the symptoms worsen or if the condition fails to improve as anticipated. I provided a total time of 15 minutes inclusive  of time utilized for medical chart review, information gathering, care coordination with staff, and documentation completion.  Rolan Hoyle, PA-C, DMSc, Nutritionist [facility] [phone]

## 2024-04-27 DIAGNOSIS — Z23 Encounter for immunization: Secondary | ICD-10-CM | POA: Diagnosis not present

## 2024-04-27 DIAGNOSIS — Z203 Contact with and (suspected) exposure to rabies: Secondary | ICD-10-CM | POA: Diagnosis not present

## 2024-04-27 DIAGNOSIS — S81851A Open bite, right lower leg, initial encounter: Secondary | ICD-10-CM | POA: Diagnosis not present

## 2024-04-27 DIAGNOSIS — W540XXA Bitten by dog, initial encounter: Secondary | ICD-10-CM | POA: Diagnosis not present

## 2024-05-01 DIAGNOSIS — W540XXA Bitten by dog, initial encounter: Secondary | ICD-10-CM | POA: Diagnosis not present

## 2024-05-01 DIAGNOSIS — Z7184 Encounter for health counseling related to travel: Secondary | ICD-10-CM | POA: Diagnosis not present

## 2024-05-06 ENCOUNTER — Ambulatory Visit: Admitting: Physician Assistant

## 2024-05-09 ENCOUNTER — Other Ambulatory Visit: Payer: Self-pay | Admitting: Physician Assistant

## 2024-05-09 DIAGNOSIS — Z6833 Body mass index (BMI) 33.0-33.9, adult: Secondary | ICD-10-CM

## 2024-05-09 DIAGNOSIS — Z23 Encounter for immunization: Secondary | ICD-10-CM | POA: Diagnosis not present

## 2024-05-09 DIAGNOSIS — Z203 Contact with and (suspected) exposure to rabies: Secondary | ICD-10-CM | POA: Diagnosis not present

## 2024-05-10 NOTE — Telephone Encounter (Signed)
 Requested Prescriptions  Pending Prescriptions Disp Refills   WEGOVY  0.5 MG/0.5ML SOAJ SQ injection [Pharmacy Med Name: Wegovy  0.5 MG/0.5ML Subcutaneous Solution Auto-injector] 4 mL 0    Sig: INJECT 1 PEN-INJECTOR SUBCUTANEOUSLY ONCE A WEEK     Endocrinology:  Diabetes - GLP-1 Receptor Agonists - semaglutide  Failed - 05/10/2024  2:43 PM      Failed - HBA1C in normal range and within 180 days    Hgb A1c MFr Bld  Date Value Ref Range Status  11/06/2023 5.9 (H) 4.8 - 5.6 % Final    Comment:             Prediabetes: 5.7 - 6.4          Diabetes: >6.4          Glycemic control for adults with diabetes: <7.0          Passed - Cr in normal range and within 360 days    Creatinine, Ser  Date Value Ref Range Status  12/08/2023 0.61 0.44 - 1.00 mg/dL Final         Passed - Valid encounter within last 6 months    Recent Outpatient Visits           2 weeks ago Dog bite of right knee, subsequent encounter   [place] [facility] at [facility], [place]   1 month ago Class 1 obesity with serious comorbidity and body mass index (BMI) of 33.0 to 33.9 in adult, unspecified obesity type   [facility] at [facility], [place]   2 months ago Class 1 obesity with serious comorbidity and body mass index (BMI) of 34.0 to 34.9 in adult, unspecified obesity type   [facility] at [facility], [place]   3 months ago Class 2 severe obesity with serious comorbidity and body mass index (BMI) of 35.0 to 35.9 in adult, unspecified obesity type [facility])   [place] [facility] at [facility], [place]   4 months ago Class 2 severe obesity with serious comorbidity and body mass index (BMI) of 35.0 to 35.9 in adult, unspecified obesity type [facility])   [place] [facility] at [facility], [place]       Future  Appointments             In 9 months Claudene Lehmann, MD [facility]

## 2024-05-12 ENCOUNTER — Telehealth: Payer: Self-pay

## 2024-05-13 ENCOUNTER — Encounter: Payer: Self-pay | Admitting: Physician Assistant

## 2024-05-13 ENCOUNTER — Ambulatory Visit: Admitting: Physician Assistant

## 2024-05-13 VITALS — BP 124/86 | HR 80 | Temp 98.4°F | Ht 70.0 in | Wt 231.0 lb

## 2024-05-13 DIAGNOSIS — F411 Generalized anxiety disorder: Secondary | ICD-10-CM | POA: Diagnosis not present

## 2024-05-13 DIAGNOSIS — Z6833 Body mass index (BMI) 33.0-33.9, adult: Secondary | ICD-10-CM | POA: Diagnosis not present

## 2024-05-13 DIAGNOSIS — E66811 Obesity, class 1: Secondary | ICD-10-CM | POA: Diagnosis not present

## 2024-05-13 MED ORDER — ESCITALOPRAM OXALATE 10 MG PO TABS: 10.0000 mg | ORAL_TABLET | Freq: Every day | ORAL | 1 refills | Status: DC

## 2024-05-13 NOTE — Assessment & Plan Note (Signed)
 Patient continues to do exceedingly well with Wegovy .  Because we are restarting escitalopram , we will not change the dose of Wegovy  today.  She says she already has a refill waiting for her at the pharmacy.  For now we will continue with the 0.5 mg dose.  Advised the importance of adequate protein intake on this medication as well as resistance training which will not only improve insulin sensitivity but also build muscle mass and reduce risk of atrophy.  Advised importance of portion control and slower eating on this medication to minimize GI side effects. Avoid high fat foods as these may cause discomfort. Patient aware of potential for weight regain when eventually stopping the medication. For this reason, continued efforts toward improving lifestyle will be particularly important moving forward.

## 2024-05-13 NOTE — Assessment & Plan Note (Signed)
 Restart escitalopram  10 mg daily.  Patient was also given the option to split the tablet in half and take 5 mg daily for the first week if desired.

## 2024-05-13 NOTE — Progress Notes (Signed)
 Date:  05/13/2024   Name:  Sheila Gilbert   DOB:  May 05, 1991   MRN:  [id number]   Chief Complaint: Weight Check (No side effects wants to increase, down 1lbs ) and Anxiety (Has not been on buspar for 1 month wants to go back on lexapro  )  HPI Sheila Gilbert presents virtually for 1 month follow-up on weight management following dose decrease of Wegovy  from 1 mg to 0.5 mg in July due to concerns for gastroparesis-like syndrome (see msgs). She also notes that she had restarted her buspirone due to anxiety at that time, unsure if perhaps that was the cause of her nausea and vomiting. Weight has stabilized a little since last visit. She has lost a stunning 53 pounds since starting the medication Nov 2024.   Reports a period of instability with her recent move, but will soon be restarting dedicated strength training with goal of 3x per week consisting 30 minutes cardio and 30 minutes strength. She also goes on walks at least twice per week. Overall she is quite satisfied with her progress.  She has noticed an increase in her symptoms of anxiety and depression, requesting to go back on Lexapro .   Medication list has been reviewed and updated.  Current Meds  Medication Sig   albuterol  (VENTOLIN  HFA) 108 (90 Base) MCG/ACT inhaler Inhale into the lungs.   clindamycin  (CLEOCIN -T) 1 % lotion Apply topically 2 (two) times daily. Apply topically BID prn   dicyclomine  (BENTYL ) 10 MG capsule Take 1 capsule (10 mg total) by mouth 3 (three) times daily as needed for spasms.   escitalopram  (LEXAPRO ) 10 MG tablet Take 1 tablet (10 mg total) by mouth daily.   etonogestrel -ethinyl estradiol  (NUVARING) 0.12-0.015 MG/24HR vaginal ring Insert vaginally and leave in place for 3 consecutive weeks, then remove for 1 week.   metoCLOPramide  (REGLAN ) 10 MG tablet Take 1 tablet (10 mg total) by mouth every 6 (six) hours as needed for nausea or vomiting.   WEGOVY  0.5 MG/0.5ML SOAJ SQ injection INJECT 1 PEN-INJECTOR SUBCUTANEOUSLY  ONCE A WEEK     Review of Systems  Patient Active Problem List   Diagnosis Date Noted   Generalized anxiety disorder 12/04/2023   Moderate episode of recurrent major depressive disorder (HCC) 12/04/2023   Mild hyperlipidemia 08/03/2023   Metabolic syndrome 08/03/2023   Class 1 obesity with serious comorbidity in adult 07/31/2023   Prediabetes 07/31/2023   Family history of breast cancer 10/30/2022   Family history of ovarian cancer 10/30/2022   Hidradenitis suppurativa 04/09/2016    Allergies  Allergen Reactions   Bee Pollen Other (See Comments)   Dust Mite Extract Other (See Comments)   Latex Swelling    Immunization History  Administered Date(s) Administered   Influenza, Seasonal, Injecte, Preservative Fre 07/31/2023   PFIZER(Purple Top)SARS-COV-2 Vaccination 02/09/2020, 03/02/2020   Rabies, IM 04/24/2024   Tdap 04/24/2024    Past Surgical History:  Procedure Laterality Date   NO PAST SURGERIES      Social History   Tobacco Use   Smoking status: Never   Smokeless tobacco: Never  Vaping Use   Vaping status: Never Used  Substance Use Topics   Alcohol use: Not Currently   Drug use: No    Family History  Problem Relation Age of Onset   Hypertension Mother    Diabetes Mother    Hypertension Father    Heart disease Father    Diabetes Father    Breast cancer Maternal Aunt 30   Breast  cancer Maternal Grandmother 50   Ovarian cancer Maternal Grandmother        50-60s        05/13/2024    9:02 AM 04/08/2024   10:49 AM 02/24/2024    8:05 AM 01/29/2024   10:03 AM  GAD 7 : Generalized Anxiety Score  Nervous, Anxious, on Edge 3 1 1 2   Control/stop worrying 3 1 1 2   Worry too much - different things 3 1 1 2   Trouble relaxing 3 2 2    Restless 3 2 2 1   Easily annoyed or irritable 2 1 1 2   Afraid - awful might happen 3 1 1 2   Total GAD 7 Score 20 9 9    Anxiety Difficulty Somewhat difficult  Somewhat difficult Somewhat difficult       05/13/2024    9:02 AM  04/08/2024   10:49 AM 02/24/2024    8:05 AM  Depression screen PHQ 2/9  Decreased Interest 2 0 1  Down, Depressed, Hopeless 1 0 1  PHQ - 2 Score 3 0 2  Altered sleeping 2 0 1  Tired, decreased energy 2 0 1  Change in appetite 1 0 1  Feeling bad or failure about yourself  2 0 0  Trouble concentrating 2 0 2  Moving slowly or fidgety/restless 3 0 2  Suicidal thoughts 0 0 0  PHQ-9 Score 15 0 9  Difficult doing work/chores Not difficult at all      BP Readings from Last 3 Encounters:  05/13/24 124/86  02/24/24 114/76  01/29/24 126/84    Wt Readings from Last 3 Encounters:  05/13/24 231 lb (104.8 kg)  04/08/24 232 lb (105.2 kg)  02/24/24 239 lb (108.4 kg)    BP 124/86   Pulse 80   Temp 98.4 F (36.9 C)   Ht 5' 10 (1.778 m)   Wt 231 lb (104.8 kg)   SpO2 97%   BMI 33.15 kg/m   Physical Exam Vitals and nursing note reviewed.  Constitutional:      Appearance: Normal appearance.  Cardiovascular:     Rate and Rhythm: Normal rate.  Pulmonary:     Effort: Pulmonary effort is normal.  Abdominal:     General: There is no distension.  Musculoskeletal:        General: Normal range of motion.  Skin:    General: Skin is warm and dry.  Neurological:     Mental Status: She is alert and oriented to person, place, and time.     Gait: Gait is intact.  Psychiatric:        Mood and Affect: Mood and affect normal.     Recent Labs     Component Value Date/Time   NA 139 12/08/2023 1728   NA 143 07/31/2023 1031   K 3.8 12/08/2023 1728   CL 108 12/08/2023 1728   CO2 21 (L) 12/08/2023 1728   GLUCOSE 87 12/08/2023 1728   BUN 12 12/08/2023 1728   BUN 8 07/31/2023 1031   CREATININE 0.61 12/08/2023 1728   CALCIUM 9.0 12/08/2023 1728   PROT 7.4 12/08/2023 1728   PROT 7.1 11/06/2023 1508   ALBUMIN 3.9 12/08/2023 1728   ALBUMIN 4.6 11/06/2023 1508   AST 26 12/08/2023 1728   ALT 46 (H) 12/08/2023 1728   ALKPHOS 48 12/08/2023 1728   BILITOT 0.6 12/08/2023 1728   BILITOT 0.3  11/06/2023 1508   GFRNONAA >60 12/08/2023 1728   GFRAA >60 10/06/2017 1944    Lab Results  Component Value Date   WBC 9.2 12/08/2023   HGB 13.6 12/08/2023   HCT 41.4 12/08/2023   MCV 88.5 12/08/2023   PLT 328 12/08/2023   Lab Results  Component Value Date   HGBA1C 5.9 (H) 11/06/2023   HGBA1C 6.4 (H) 07/31/2023   Lab Results  Component Value Date   CHOL 198 11/06/2023   HDL 38 (L) 11/06/2023   LDLCALC 134 (H) 11/06/2023   TRIG 146 11/06/2023   CHOLHDL 5.2 (H) 11/06/2023   Lab Results  Component Value Date   TSH 1.500 07/31/2023      Assessment and Plan:  Class 1 obesity with serious comorbidity and body mass index (BMI) of 33.0 to 33.9 in adult, unspecified obesity type Assessment & Plan: Patient continues to do exceedingly well with Wegovy .  Because we are restarting escitalopram , we will not change the dose of Wegovy  today.  She says she already has a refill waiting for her at the pharmacy.  For now we will continue with the 0.5 mg dose.  Advised the importance of adequate protein intake on this medication as well as resistance training which will not only improve insulin sensitivity but also build muscle mass and reduce risk of atrophy.  Advised importance of portion control and slower eating on this medication to minimize GI side effects. Avoid high fat foods as these may cause discomfort. Patient aware of potential for weight regain when eventually stopping the medication. For this reason, continued efforts toward improving lifestyle will be particularly important moving forward.     Generalized anxiety disorder Assessment & Plan: Restart escitalopram  10 mg daily.  Patient was also given the option to split the tablet in half and take 5 mg daily for the first week if desired.  Orders: -     Escitalopram  Oxalate; Take 1 tablet (10 mg total) by mouth daily.  Dispense: 90 tablet; Refill: 1     Return in about 4 weeks (around 06/10/2024) for OV f/u Wegovy , anx.     Rolan Hoyle, PA-C, DMSc, Nutritionist [facility] Primary Care and Sports Medicine [facility] [phone]

## 2024-05-18 NOTE — Telephone Encounter (Signed)
 Reached out to patient and was advised she reached out pharmacy and was told her Rx is ready for pick up. Disregard this request.

## 2024-06-10 ENCOUNTER — Ambulatory Visit: Admitting: Physician Assistant

## 2024-06-10 ENCOUNTER — Encounter: Payer: Self-pay | Admitting: Physician Assistant

## 2024-06-10 VITALS — BP 108/70 | HR 83 | Ht 70.0 in | Wt 230.5 lb

## 2024-06-10 DIAGNOSIS — F331 Major depressive disorder, recurrent, moderate: Secondary | ICD-10-CM | POA: Diagnosis not present

## 2024-06-10 DIAGNOSIS — Z6833 Body mass index (BMI) 33.0-33.9, adult: Secondary | ICD-10-CM

## 2024-06-10 DIAGNOSIS — E66811 Obesity, class 1: Secondary | ICD-10-CM | POA: Diagnosis not present

## 2024-06-10 MED ORDER — WEGOVY 0.5 MG/0.5ML ~~LOC~~ SOAJ: 0.5000 mg | SUBCUTANEOUS | 0 refills | Status: DC

## 2024-06-10 NOTE — Progress Notes (Signed)
 Date:  06/10/2024   Name:  Sheila Gilbert   DOB:  1991-07-15   MRN:  [id number]   Chief Complaint: Weight Check (WEGOVY ) and Anxiety  HPI Sheila Gilbert presents virtually for 1 month follow-up on weight management presently using Wegovy  0.5 mg  following dose decrease in July due to concerns for gastroparesis-like syndrome (see msgs). Weight is stable. She has lost a stunning 53 pounds since starting the medication Nov 2024.   She admits she has not been doing very well with her physical activity as she has an upcoming CMA (accounting) exam in about 3 weeks and has been focusing on her studies.  Last visit we restarted Lexapro  due to an uptick in symptoms of anxiety and depression.  For the first 2 weeks, she had regular disruptions in her sleep from about 2 to 5 AM.  Presently she finds that she oversleeps, stating that she goes to bed later than desired around 11 PM and finds it difficult to wake up sooner than 7 AM, stating this is completely thrown off her morning routine.  She admits that there might be confounding variables including recent stress and a disruption to her physical activity regimen.   Medication list has been reviewed and updated.  Current Meds  Medication Sig   albuterol  (VENTOLIN  HFA) 108 (90 Base) MCG/ACT inhaler Inhale into the lungs.   clindamycin  (CLEOCIN -T) 1 % lotion Apply topically 2 (two) times daily. Apply topically BID prn   dicyclomine  (BENTYL ) 10 MG capsule Take 1 capsule (10 mg total) by mouth 3 (three) times daily as needed for spasms.   escitalopram  (LEXAPRO ) 10 MG tablet Take 1 tablet (10 mg total) by mouth daily.   etonogestrel -ethinyl estradiol  (NUVARING) 0.12-0.015 MG/24HR vaginal ring Insert vaginally and leave in place for 3 consecutive weeks, then remove for 1 week.   metoCLOPramide  (REGLAN ) 10 MG tablet Take 1 tablet (10 mg total) by mouth every 6 (six) hours as needed for nausea or vomiting.   [DISCONTINUED] WEGOVY  0.5 MG/0.5ML SOAJ SQ injection  INJECT 1 PEN-INJECTOR SUBCUTANEOUSLY ONCE A WEEK     Review of Systems  Patient Active Problem List   Diagnosis Date Noted   Generalized anxiety disorder 12/04/2023   Moderate episode of recurrent major depressive disorder (HCC) 12/04/2023   Mild hyperlipidemia 08/03/2023   Metabolic syndrome 08/03/2023   Class 1 obesity with serious comorbidity in adult 07/31/2023   Prediabetes 07/31/2023   Family history of breast cancer 10/30/2022   Family history of ovarian cancer 10/30/2022   Hidradenitis suppurativa 04/09/2016    Allergies  Allergen Reactions   Bee Pollen Other (See Comments)   Dust Mite Extract Other (See Comments)   Latex Swelling    Immunization History  Administered Date(s) Administered   Influenza, Seasonal, Injecte, Preservative Fre 07/31/2023   PFIZER(Purple Top)SARS-COV-2 Vaccination 02/09/2020, 03/02/2020   Rabies, IM 04/24/2024, 05/01/2024, 05/09/2024   Tdap 04/24/2024    Past Surgical History:  Procedure Laterality Date   NO PAST SURGERIES      Social History   Tobacco Use   Smoking status: Never   Smokeless tobacco: Never  Vaping Use   Vaping status: Never Used  Substance Use Topics   Alcohol use: Not Currently   Drug use: No    Family History  Problem Relation Age of Onset   Hypertension Mother    Diabetes Mother    Hypertension Father    Heart disease Father    Diabetes Father    Breast cancer Maternal Aunt  30   Breast cancer Maternal Grandmother 50   Ovarian cancer Maternal Grandmother        50-60s        06/10/2024    8:31 AM 05/13/2024    9:02 AM 04/08/2024   10:49 AM 02/24/2024    8:05 AM  GAD 7 : Generalized Anxiety Score  Nervous, Anxious, on Edge 2 3 1 1   Control/stop worrying 2 3 1 1   Worry too much - different things 2 3 1 1   Trouble relaxing 2 3 2 2   Restless 1 3 2 2   Easily annoyed or irritable 1 2 1 1   Afraid - awful might happen 2 3 1 1   Total GAD 7 Score 12 20 9 9   Anxiety Difficulty Somewhat difficult  Somewhat difficult  Somewhat difficult       06/10/2024    8:31 AM 05/13/2024    9:02 AM 04/08/2024   10:49 AM  Depression screen PHQ 2/9  Decreased Interest 1 2 0  Down, Depressed, Hopeless 1 1 0  PHQ - 2 Score 2 3 0  Altered sleeping 3 2 0  Tired, decreased energy 3 2 0  Change in appetite 1 1 0  Feeling bad or failure about yourself  2 2 0  Trouble concentrating 2 2 0  Moving slowly or fidgety/restless 1 3 0  Suicidal thoughts 0 0 0  PHQ-9 Score 14 15 0  Difficult doing work/chores Not difficult at all Not difficult at all     BP Readings from Last 3 Encounters:  06/10/24 108/70  05/13/24 124/86  02/24/24 114/76    Wt Readings from Last 3 Encounters:  06/10/24 230 lb 8 oz (104.6 kg)  05/13/24 231 lb (104.8 kg)  04/08/24 232 lb (105.2 kg)    BP 108/70   Pulse 83   Ht 5' 10 (1.778 m)   Wt 230 lb 8 oz (104.6 kg)   LMP 06/04/2024 (Approximate)   SpO2 97%   BMI 33.07 kg/m   Physical Exam Vitals and nursing note reviewed.  Constitutional:      Appearance: Normal appearance.  Cardiovascular:     Rate and Rhythm: Normal rate.  Pulmonary:     Effort: Pulmonary effort is normal.  Abdominal:     General: There is no distension.  Musculoskeletal:        General: Normal range of motion.  Skin:    General: Skin is warm and dry.  Neurological:     Mental Status: She is alert and oriented to person, place, and time.     Gait: Gait is intact.  Psychiatric:        Mood and Affect: Mood and affect normal.     Recent Labs     Component Value Date/Time   NA 139 12/08/2023 1728   NA 143 07/31/2023 1031   K 3.8 12/08/2023 1728   CL 108 12/08/2023 1728   CO2 21 (L) 12/08/2023 1728   GLUCOSE 87 12/08/2023 1728   BUN 12 12/08/2023 1728   BUN 8 07/31/2023 1031   CREATININE 0.61 12/08/2023 1728   CALCIUM 9.0 12/08/2023 1728   PROT 7.4 12/08/2023 1728   PROT 7.1 11/06/2023 1508   ALBUMIN 3.9 12/08/2023 1728   ALBUMIN 4.6 11/06/2023 1508   AST 26 12/08/2023 1728    ALT 46 (H) 12/08/2023 1728   ALKPHOS 48 12/08/2023 1728   BILITOT 0.6 12/08/2023 1728   BILITOT 0.3 11/06/2023 1508   GFRNONAA >60 12/08/2023 1728  GFRAA >60 10/06/2017 1944    Lab Results  Component Value Date   WBC 9.2 12/08/2023   HGB 13.6 12/08/2023   HCT 41.4 12/08/2023   MCV 88.5 12/08/2023   PLT 328 12/08/2023   Lab Results  Component Value Date   HGBA1C 5.9 (H) 11/06/2023   HGBA1C 6.4 (H) 07/31/2023   Lab Results  Component Value Date   CHOL 198 11/06/2023   HDL 38 (L) 11/06/2023   LDLCALC 134 (H) 11/06/2023   TRIG 146 11/06/2023   CHOLHDL 5.2 (H) 11/06/2023   Lab Results  Component Value Date   TSH 1.500 07/31/2023      Assessment and Plan:  Moderate episode of recurrent major depressive disorder [facility]) Assessment & Plan: Improved, but certainly not at goal. Patient feels this is strongly related to her upcoming exam. Through shared decision-making, will keep Lexapro  dose the same for now so as not to rock the boat. She also has several big work trips this month.    Class 1 obesity with serious comorbidity and body mass index (BMI) of 33.0 to 33.9 in adult, unspecified obesity type Assessment & Plan: Continue Wegovy  0.5 mg. Considered dose increase but will work on lifestyle changes before we do that.   Advised the importance of adequate protein intake on this medication as well as resistance training which will not only improve insulin sensitivity but also build muscle mass and reduce risk of atrophy.  Advised importance of portion control and slower eating on this medication to minimize GI side effects. Avoid high fat foods as these may cause discomfort. Patient aware of potential for weight regain when eventually stopping the medication. For this reason, continued efforts toward improving lifestyle will be particularly important moving forward.    Orders: -     Wegovy ; Inject 0.5 mg into the skin once a week.  Dispense: 2 mL; Refill: 0     Return  in about 5 weeks (around 07/18/2024) for OV f/u Wegovy , anx.    Rolan Hoyle, PA-C, DMSc, Nutritionist [facility] [phone]

## 2024-06-10 NOTE — Assessment & Plan Note (Signed)
 Continue Wegovy  0.5 mg. Considered dose increase but will work on lifestyle changes before we do that.   Advised the importance of adequate protein intake on this medication as well as resistance training which will not only improve insulin sensitivity but also build muscle mass and reduce risk of atrophy.  Advised importance of portion control and slower eating on this medication to minimize GI side effects. Avoid high fat foods as these may cause discomfort. Patient aware of potential for weight regain when eventually stopping the medication. For this reason, continued efforts toward improving lifestyle will be particularly important moving forward.

## 2024-06-10 NOTE — Assessment & Plan Note (Signed)
 Improved, but certainly not at goal. Patient feels this is strongly related to her upcoming exam. Through shared decision-making, will keep Lexapro  dose the same for now so as not to rock the boat. She also has several big work trips this month.

## 2024-07-13 ENCOUNTER — Ambulatory Visit: Admitting: Physician Assistant

## 2024-07-13 VITALS — BP 116/80 | HR 62 | Temp 98.0°F | Ht 70.0 in | Wt 229.4 lb

## 2024-07-13 DIAGNOSIS — Z6833 Body mass index (BMI) 33.0-33.9, adult: Secondary | ICD-10-CM

## 2024-07-13 DIAGNOSIS — E66811 Obesity, class 1: Secondary | ICD-10-CM

## 2024-07-13 MED ORDER — WEGOVY 1 MG/0.5ML ~~LOC~~ SOAJ: 1.0000 mg | SUBCUTANEOUS | 0 refills | Status: DC

## 2024-07-13 NOTE — Progress Notes (Signed)
 Date:  07/13/2024   Name:  Sheila Gilbert   DOB:  1991/04/26   MRN:  [id number]   Chief Complaint: Anxiety (Patient states that she feels like her mood has improved from last visit) and Obesity  HPI  Sheila Gilbert presents virtually for 1 month follow-up on weight management presently using Wegovy  0.5 mg  following dose decrease in July due to concerns for gastroparesis-like syndrome (see msgs). Weight is stable. She has lost a stunning 53 pounds since starting the medication Nov 2024.  She is working to improve her physical activity and has just signed up for a new gym membership which she seems excited about.  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  She is planning to retake it in February.  For now, she feels like a great deal of stress has been lifted.  Uses a NuvaRing for contraception, stratification a few weeks ago and noticed improvements in her mood and interests.  Wonders if it could be hormonally related or perhaps related to steady state of Lexapro .   Medication list has been reviewed and updated.  Current Meds  Medication Sig   albuterol  (VENTOLIN  HFA) 108 (90 Base) MCG/ACT inhaler Inhale into the lungs.   clindamycin  (CLEOCIN -T) 1 % lotion Apply topically 2 (two) times daily. Apply topically BID prn   dicyclomine  (BENTYL ) 10 MG capsule Take 1 capsule (10 mg total) by mouth 3 (three) times daily as needed for spasms.   escitalopram  (LEXAPRO ) 10 MG tablet Take 1 tablet (10 mg total) by mouth daily.   etonogestrel -ethinyl estradiol  (NUVARING) 0.12-0.015 MG/24HR vaginal ring Insert vaginally and leave in place for 3 consecutive weeks, then remove for 1 week.   metoCLOPramide  (REGLAN ) 10 MG tablet Take 1 tablet (10 mg total) by mouth every 6 (six) hours as needed for nausea or vomiting.   WEGOVY  1 MG/0.5ML SOAJ SQ injection Inject 1 mg into the skin once a week.   [DISCONTINUED] WEGOVY  0.5 MG/0.5ML SOAJ SQ  injection Inject 0.5 mg into the skin once a week.     Review of Systems  Patient Active Problem List   Diagnosis Date Noted   Generalized anxiety disorder 12/04/2023   Moderate episode of recurrent major depressive disorder (HCC) 12/04/2023   Mild hyperlipidemia 08/03/2023   Metabolic syndrome 08/03/2023   Class 1 obesity with serious comorbidity in adult 07/31/2023   Prediabetes 07/31/2023   Family history of breast cancer 10/30/2022   Family history of ovarian cancer 10/30/2022   Hidradenitis suppurativa 04/09/2016    Allergies  Allergen Reactions   Bee Pollen Other (See Comments)   Dust Mite Extract Other (See Comments)   Latex Swelling    Immunization History  Administered Date(s) Administered   Influenza, Seasonal, Injecte, Preservative Fre 07/31/2023   PFIZER(Purple Top)SARS-COV-2 Vaccination 02/09/2020, 03/02/2020   Rabies, IM 04/24/2024, 05/01/2024, 05/09/2024   Tdap 04/24/2024    Past Surgical History:  Procedure Laterality Date   NO PAST SURGERIES      Social History   Tobacco Use   Smoking status: Never   Smokeless tobacco: Never  Vaping Use   Vaping status: Never Used  Substance Use Topics   Alcohol use: Not Currently   Drug use: No    Family History  Problem Relation Age of Onset   Hypertension Mother    Diabetes Mother    Hypertension Father    Heart disease Father    Diabetes  Father    Breast cancer Maternal Aunt 30   Breast cancer Maternal Grandmother 50   Ovarian cancer Maternal Grandmother        50-60s        07/13/2024    8:45 AM 06/10/2024    8:31 AM 05/13/2024    9:02 AM 04/08/2024   10:49 AM  GAD 7 : Generalized Anxiety Score  Nervous, Anxious, on Edge 2 2 3 1   Control/stop worrying 1 2 3 1   Worry too much - different things 1 2 3 1   Trouble relaxing 1 2 3 2   Restless 2 1 3 2   Easily annoyed or irritable 1 1 2 1   Afraid - awful might happen 1 2 3 1   Total GAD 7 Score 9 12 20 9   Anxiety Difficulty Somewhat difficult  Somewhat difficult Somewhat difficult        07/13/2024    8:44 AM 06/10/2024    8:31 AM 05/13/2024    9:02 AM  Depression screen PHQ 2/9  Decreased Interest 1 1 2   Down, Depressed, Hopeless 0 1 1  PHQ - 2 Score 1 2 3   Altered sleeping 2 3 2   Tired, decreased energy 2 3 2   Change in appetite 2 1 1   Feeling bad or failure about yourself  1 2 2   Trouble concentrating 1 2 2   Moving slowly or fidgety/restless 1 1 3   Suicidal thoughts 0 0 0  PHQ-9 Score 10 14 15   Difficult doing work/chores Somewhat difficult Not difficult at all Not difficult at all    BP Readings from Last 3 Encounters:  07/13/24 116/80  06/10/24 108/70  05/13/24 124/86    Wt Readings from Last 3 Encounters:  07/13/24 229 lb 6.4 oz (104.1 kg)  06/10/24 230 lb 8 oz (104.6 kg)  05/13/24 231 lb (104.8 kg)    BP 116/80   Pulse 62   Temp 98 F (36.7 C) (Oral)   Ht 5' 10 (1.778 m)   Wt 229 lb 6.4 oz (104.1 kg)   SpO2 98%   BMI 32.92 kg/m   Physical Exam Vitals and nursing note reviewed.  Constitutional:      Appearance: Normal appearance.  Cardiovascular:     Rate and Rhythm: Normal rate.  Pulmonary:     Effort: Pulmonary effort is normal.  Abdominal:     General: There is no distension.  Musculoskeletal:        General: Normal range of motion.  Skin:    General: Skin is warm and dry.  Neurological:     Mental Status: She is alert and oriented to person, place, and time.     Gait: Gait is intact.  Psychiatric:        Mood and Affect: Mood and affect normal.     Recent Labs     Component Value Date/Time   NA 139 12/08/2023 1728   NA 143 07/31/2023 1031   K 3.8 12/08/2023 1728   CL 108 12/08/2023 1728   CO2 21 (L) 12/08/2023 1728   GLUCOSE 87 12/08/2023 1728   BUN 12 12/08/2023 1728   BUN 8 07/31/2023 1031   CREATININE 0.61 12/08/2023 1728   CALCIUM 9.0 12/08/2023 1728   PROT 7.4 12/08/2023 1728   PROT 7.1 11/06/2023 1508   ALBUMIN 3.9 12/08/2023 1728   ALBUMIN 4.6 11/06/2023 1508    AST 26 12/08/2023 1728   ALT 46 (H) 12/08/2023 1728   ALKPHOS 48 12/08/2023 1728   BILITOT 0.6 12/08/2023  1728   BILITOT 0.3 11/06/2023 1508   GFRNONAA >60 12/08/2023 1728   GFRAA >60 10/06/2017 1944    Lab Results  Component Value Date   WBC 9.2 12/08/2023   HGB 13.6 12/08/2023   HCT 41.4 12/08/2023   MCV 88.5 12/08/2023   PLT 328 12/08/2023   Lab Results  Component Value Date   HGBA1C 5.9 (H) 11/06/2023   HGBA1C 6.4 (H) 07/31/2023   Lab Results  Component Value Date   CHOL 198 11/06/2023   HDL 38 (L) 11/06/2023   LDLCALC 134 (H) 11/06/2023   TRIG 146 11/06/2023   CHOLHDL 5.2 (H) 11/06/2023   Lab Results  Component Value Date   TSH 1.500 07/31/2023      Assessment and Plan:  Class 1 obesity with serious comorbidity and body mass index (BMI) of 33.0 to 33.9 in adult, unspecified obesity type Assessment & Plan: Patient would like to reattempt dose increase to 1 mg which I feel is appropriate.  She has metoclopramide  if needed.  Advised the importance of adequate protein intake on this medication as well as resistance training which will not only improve insulin sensitivity but also build muscle mass and reduce risk of atrophy.  Advised importance of portion control and slower eating on this medication to minimize GI side effects. Avoid high fat foods as these may cause discomfort. Patient aware of potential for weight regain when eventually stopping the medication. For this reason, continued efforts toward improving lifestyle will be particularly important moving forward.    Orders: -     Wegovy ; Inject 1 mg into the skin once a week.  Dispense: 2 mL; Refill: 0     Return in about 4 weeks (around 08/10/2024) for OV f/u weight mgmt.    Rolan Hoyle, PA-C, DMSc, Nutritionist [facility] Primary Care and Sports Medicine [facility] [phone]

## 2024-07-13 NOTE — Assessment & Plan Note (Signed)
 Patient would like to reattempt dose increase to 1 mg which I feel is appropriate.  She has metoclopramide  if needed.  Advised the importance of adequate protein intake on this medication as well as resistance training which will not only improve insulin sensitivity but also build muscle mass and reduce risk of atrophy.  Advised importance of portion control and slower eating on this medication to minimize GI side effects. Avoid high fat foods as these may cause discomfort. Patient aware of potential for weight regain when eventually stopping the medication. For this reason, continued efforts toward improving lifestyle will be particularly important moving forward.

## 2024-07-17 DIAGNOSIS — J029 Acute pharyngitis, unspecified: Secondary | ICD-10-CM | POA: Diagnosis not present

## 2024-07-17 DIAGNOSIS — H6991 Unspecified Eustachian tube disorder, right ear: Secondary | ICD-10-CM | POA: Diagnosis not present

## 2024-07-17 DIAGNOSIS — J069 Acute upper respiratory infection, unspecified: Secondary | ICD-10-CM | POA: Diagnosis not present

## 2024-07-17 DIAGNOSIS — H6121 Impacted cerumen, right ear: Secondary | ICD-10-CM | POA: Diagnosis not present

## 2024-07-18 ENCOUNTER — Ambulatory Visit: Admitting: Physician Assistant

## 2024-08-08 ENCOUNTER — Other Ambulatory Visit: Payer: Self-pay | Admitting: Physician Assistant

## 2024-08-08 DIAGNOSIS — E66811 Obesity, class 1: Secondary | ICD-10-CM

## 2024-08-11 ENCOUNTER — Ambulatory Visit: Admitting: Physician Assistant

## 2024-08-11 NOTE — Telephone Encounter (Signed)
 Please review.  KP

## 2024-08-11 NOTE — Telephone Encounter (Signed)
 Requested medication (s) are due for refill today: yes  Requested medication (s) are on the active medication list: yes  Last refill:  07/13/24  Future visit scheduled: yes,08/12/24  Notes to clinic:  Routing for review of dose, advised to F/U for refill.     Requested Prescriptions  Pending Prescriptions Disp Refills   WEGOVY  1 MG/0.5ML SOAJ SQ injection [Pharmacy Med Name: Wegovy  1 MG/0.5ML Subcutaneous Solution Auto-injector] 4 mL 0    Sig: INJECT 1MG  INTO THE SKIN   ONCE A WEEK     Endocrinology:  Diabetes - GLP-1 Receptor Agonists - semaglutide  Failed - 08/11/2024 11:45 AM      Failed - HBA1C in normal range and within 180 days    Hgb A1c MFr Bld  Date Value Ref Range Status  11/06/2023 5.9 (H) 4.8 - 5.6 % Final    Comment:             Prediabetes: 5.7 - 6.4          Diabetes: >6.4          Glycemic control for adults with diabetes: <7.0          Passed - Cr in normal range and within 360 days    Creatinine, Ser  Date Value Ref Range Status  12/08/2023 0.61 0.44 - 1.00 mg/dL Final         Passed - Valid encounter within last 6 months    Recent Outpatient Visits           4 weeks ago Class 1 obesity with serious comorbidity and body mass index (BMI) of 33.0 to 33.9 in adult, unspecified obesity type   [facility] at [facility], [place], [place]   2 months ago Moderate episode of recurrent major depressive disorder [facility])   [place] Primary Care & Sports Medicine at [facility], [place]   3 months ago Class 1 obesity with serious comorbidity and body mass index (BMI) of 33.0 to 33.9 in adult, unspecified obesity type   [facility] Primary Care & Sports Medicine at [facility], [place]   3 months ago Dog bite of right knee, subsequent encounter    Primary Care & Sports Medicine at [facility], [place], [place]   4 months ago Class 1 obesity with serious comorbidity and  body mass index (BMI) of 33.0 to 33.9 in adult, unspecified obesity type   [facility] Primary Care & Sports Medicine at [facility], [place], [place]       Future Appointments             In 6 months Claudene Lehmann, MD [facility]

## 2024-08-12 ENCOUNTER — Ambulatory Visit: Admitting: Physician Assistant

## 2024-08-12 ENCOUNTER — Encounter: Payer: Self-pay | Admitting: Physician Assistant

## 2024-08-12 VITALS — BP 126/86 | HR 83 | Temp 98.6°F | Ht 70.0 in | Wt 225.0 lb

## 2024-08-12 DIAGNOSIS — Z6833 Body mass index (BMI) 33.0-33.9, adult: Secondary | ICD-10-CM | POA: Diagnosis not present

## 2024-08-12 DIAGNOSIS — F411 Generalized anxiety disorder: Secondary | ICD-10-CM

## 2024-08-12 DIAGNOSIS — E66811 Obesity, class 1: Secondary | ICD-10-CM | POA: Diagnosis not present

## 2024-08-12 MED ORDER — ESCITALOPRAM OXALATE 10 MG PO TABS: 5.0000 mg | ORAL_TABLET | Freq: Every day | ORAL | Status: DC

## 2024-08-12 MED ORDER — WEGOVY 1 MG/0.5ML ~~LOC~~ SOAJ: 1.0000 mg | SUBCUTANEOUS | 0 refills | Status: DC

## 2024-08-12 NOTE — Progress Notes (Signed)
 Date:  08/12/2024   Name:  Sheila Gilbert   DOB:  [birth year]   MRN:  [id number]   Chief Complaint: Weight Check (Nausea, gastro problem, doesn't know if medication is clashing with anxiety medication )  HPI  Yalitza returns for 1 month follow-up on weight management presently on Wegovy  which was increased to 1 mg last visit. Down about 4 lb since last time. She has lost a stunning 57 pounds since starting the medication Nov 2024.  Unfortunately she thinks that with this dose increase to Wegovy , it has caused her to have GI side effects when she takes her escitalopram .  The most recent example of this was a few days ago when she took Wegovy  on Friday, forgot her escitalopram  Saturday and Sunday with no issues, then took the escitalopram  on Monday and had fairly acute nausea and abdominal pain afterwards.  She believes that it is the combination of the Wegovy  1 mg and the current dose of escitalopram  that is causing the problem.    She is working to improve her physical activity and has recently signed up for a new gym membership, but has not gone for the entire last month due to feeling poorly.  Medication list has been reviewed and updated.  Current Meds  Medication Sig   albuterol  (VENTOLIN  HFA) 108 (90 Base) MCG/ACT inhaler Inhale into the lungs.   clindamycin  (CLEOCIN -T) 1 % lotion Apply topically 2 (two) times daily. Apply topically BID prn   dicyclomine  (BENTYL ) 10 MG capsule Take 1 capsule (10 mg total) by mouth 3 (three) times daily as needed for spasms.   etonogestrel -ethinyl estradiol  (NUVARING) 0.12-0.015 MG/24HR vaginal ring Insert vaginally and leave in place for 3 consecutive weeks, then remove for 1 week.   metoCLOPramide  (REGLAN ) 10 MG tablet Take 1 tablet (10 mg total) by mouth every 6 (six) hours as needed for nausea or vomiting.   [DISCONTINUED] escitalopram  (LEXAPRO ) 10 MG tablet Take 1 tablet (10 mg total) by mouth daily.   [DISCONTINUED] WEGOVY  1 MG/0.5ML SOAJ SQ injection  Inject 1 mg into the skin once a week.     Review of Systems  Patient Active Problem List   Diagnosis Date Noted   Generalized anxiety disorder 12/04/2023   Moderate episode of recurrent major depressive disorder (HCC) 12/04/2023   Mild hyperlipidemia 08/03/2023   Metabolic syndrome 08/03/2023   Class 1 obesity with serious comorbidity in adult 07/31/2023   Prediabetes 07/31/2023   Family history of breast cancer 10/30/2022   Family history of ovarian cancer 10/30/2022   Hidradenitis suppurativa 04/09/2016    Allergies  Allergen Reactions   Bee Pollen Other (See Comments)   Dust Mite Extract Other (See Comments)   Latex Swelling    Immunization History  Administered Date(s) Administered   Influenza, Seasonal, Injecte, Preservative Fre 07/31/2023   PFIZER(Purple Top)SARS-COV-2 Vaccination 02/09/2020, 03/02/2020   Rabies, IM 04/24/2024, 05/01/2024, 05/09/2024   Tdap 04/24/2024    Past Surgical History:  Procedure Laterality Date   NO PAST SURGERIES      Social History   Tobacco Use   Smoking status: Never   Smokeless tobacco: Never  Vaping Use   Vaping status: Never Used  Substance Use Topics   Alcohol use: Not Currently   Drug use: No    Family History  Problem Relation Age of Onset   Hypertension Mother    Diabetes Mother    Hypertension Father    Heart disease Father    Diabetes Father  Breast cancer Maternal Aunt 30   Breast cancer Maternal Grandmother 50   Ovarian cancer Maternal Grandmother        50-60s        07/13/2024    8:45 AM 06/10/2024    8:31 AM 05/13/2024    9:02 AM 04/08/2024   10:49 AM  GAD 7 : Generalized Anxiety Score  Nervous, Anxious, on Edge 2 2 3 1   Control/stop worrying 1 2 3 1   Worry too much - different things 1 2 3 1   Trouble relaxing 1 2 3 2   Restless 2 1 3 2   Easily annoyed or irritable 1 1 2 1   Afraid - awful might happen 1 2 3 1   Total GAD 7 Score 9 12 20 9   Anxiety Difficulty Somewhat difficult Somewhat  difficult Somewhat difficult        07/13/2024    8:44 AM 06/10/2024    8:31 AM 05/13/2024    9:02 AM  Depression screen PHQ 2/9  Decreased Interest 1 1 2   Down, Depressed, Hopeless 0 1 1  PHQ - 2 Score 1 2 3   Altered sleeping 2 3 2   Tired, decreased energy 2 3 2   Change in appetite 2 1 1   Feeling bad or failure about yourself  1 2 2   Trouble concentrating 1 2 2   Moving slowly or fidgety/restless 1 1 3   Suicidal thoughts 0 0 0  PHQ-9 Score 10  14  15    Difficult doing work/chores Somewhat difficult Not difficult at all Not difficult at all     Data saved with a previous flowsheet row definition    BP Readings from Last 3 Encounters:  08/12/24 126/86  07/13/24 116/80  06/10/24 108/70    Wt Readings from Last 3 Encounters:  08/12/24 225 lb (102.1 kg)  07/13/24 229 lb 6.4 oz (104.1 kg)  06/10/24 230 lb 8 oz (104.6 kg)    BP 126/86   Pulse 83   Temp 98.6 F (37 C)   Ht 5' 10 (1.778 m)   Wt 225 lb (102.1 kg)   SpO2 98%   BMI 32.28 kg/m   Physical Exam Vitals and nursing note reviewed.  Constitutional:      Appearance: Normal appearance.  Cardiovascular:     Rate and Rhythm: Normal rate.  Pulmonary:     Effort: Pulmonary effort is normal.  Abdominal:     General: There is no distension.  Musculoskeletal:        General: Normal range of motion.  Skin:    General: Skin is warm and dry.  Neurological:     Mental Status: She is alert and oriented to person, place, and time.     Gait: Gait is intact.  Psychiatric:        Mood and Affect: Mood and affect normal.     Recent Labs     Component Value Date/Time   NA 139 12/08/2023 1728   NA 143 07/31/2023 1031   K 3.8 12/08/2023 1728   CL 108 12/08/2023 1728   CO2 21 (L) 12/08/2023 1728   GLUCOSE 87 12/08/2023 1728   BUN 12 12/08/2023 1728   BUN 8 07/31/2023 1031   CREATININE 0.61 12/08/2023 1728   CALCIUM 9.0 12/08/2023 1728   PROT 7.4 12/08/2023 1728   PROT 7.1 11/06/2023 1508   ALBUMIN 3.9  12/08/2023 1728   ALBUMIN 4.6 11/06/2023 1508   AST 26 12/08/2023 1728   ALT 46 (H) 12/08/2023 1728   ALKPHOS  48 12/08/2023 1728   BILITOT 0.6 12/08/2023 1728   BILITOT 0.3 11/06/2023 1508   GFRNONAA >60 12/08/2023 1728   GFRAA >60 10/06/2017 1944    Lab Results  Component Value Date   WBC 9.2 12/08/2023   HGB 13.6 12/08/2023   HCT 41.4 12/08/2023   MCV 88.5 12/08/2023   PLT 328 12/08/2023   Lab Results  Component Value Date   HGBA1C 5.9 (H) 11/06/2023   HGBA1C 6.4 (H) 07/31/2023   Lab Results  Component Value Date   CHOL 198 11/06/2023   HDL 38 (L) 11/06/2023   LDLCALC 134 (H) 11/06/2023   TRIG 146 11/06/2023   CHOLHDL 5.2 (H) 11/06/2023   Lab Results  Component Value Date   TSH 1.500 07/31/2023      Assessment and Plan:  Class 1 obesity with serious comorbidity and body mass index (BMI) of 33.0 to 33.9 in adult, unspecified obesity type Assessment & Plan: For now, no dose adjustment to Wegovy ; we will instead try to reduce her escitalopram  dose first.  If this does not improve things in the next 1 to 2 weeks, she will let me know via MyChart and we will switch to Zepbound for her instead; she was given a free 1 month coupon for starter dose for Zepbound in case this is what we decide to do.  Advised the importance of adequate protein intake on this medication as well as resistance training which will not only improve insulin sensitivity but also build muscle mass and reduce risk of atrophy.  Advised importance of portion control and slower eating on this medication to minimize GI side effects. Avoid high fat foods as these may cause discomfort. Patient aware of potential for weight regain when eventually stopping the medication. For this reason, continued efforts toward improving lifestyle will be particularly important moving forward.   Orders: -     Comprehensive metabolic panel with GFR -     Hemoglobin A1c -     Lipid panel -     TSH -     Wegovy ; Inject 1 mg  into the skin once a week.  Dispense: 2 mL; Refill: 0  Generalized anxiety disorder Assessment & Plan: Cut escitalopram  in half, take 5 mg daily.  Orders: -     Escitalopram  Oxalate; Take 0.5 tablets (5 mg total) by mouth daily.    Follow-up 1 month OV Wegovy    Rolan Hoyle, PA-C, DMSc, Nutritionist [facility] [phone]

## 2024-08-12 NOTE — Assessment & Plan Note (Signed)
 Cut escitalopram  in half, take 5 mg daily.

## 2024-08-12 NOTE — Assessment & Plan Note (Signed)
 For now, no dose adjustment to Wegovy ; we will instead try to reduce her escitalopram  dose first.  If this does not improve things in the next 1 to 2 weeks, she will let me know via MyChart and we will switch to Zepbound for her instead; she was given a free 1 month coupon for starter dose for Zepbound in case this is what we decide to do.  Advised the importance of adequate protein intake on this medication as well as resistance training which will not only improve insulin sensitivity but also build muscle mass and reduce risk of atrophy.  Advised importance of portion control and slower eating on this medication to minimize GI side effects. Avoid high fat foods as these may cause discomfort. Patient aware of potential for weight regain when eventually stopping the medication. For this reason, continued efforts toward improving lifestyle will be particularly important moving forward.

## 2024-08-13 LAB — LIPID PANEL
Chol/HDL Ratio: 4.4 ratio (ref 0.0–4.4)
Cholesterol, Total: 196 mg/dL (ref 100–199)
HDL: 45 mg/dL (ref >39)
LDL Chol Calc (NIH): 134 mg/dL — ABNORMAL HIGH (ref 0–99)
Triglycerides: 91 mg/dL (ref 0–149)
VLDL Cholesterol Cal: 17 mg/dL (ref 5–40)

## 2024-08-13 LAB — COMPREHENSIVE METABOLIC PANEL WITH GFR
ALT: 24 IU/L (ref 0–32)
AST: 19 IU/L (ref 0–40)
Albumin: 4.4 g/dL (ref 3.9–4.9)
Alkaline Phosphatase: 58 IU/L (ref 41–116)
BUN/Creatinine Ratio: 18 (ref 9–23)
BUN: 12 mg/dL (ref 6–20)
Bilirubin Total: 0.3 mg/dL (ref 0.0–1.2)
CO2: 22 mmol/L (ref 20–29)
Calcium: 9.3 mg/dL (ref 8.7–10.2)
Chloride: 104 mmol/L (ref 96–106)
Creatinine, Ser: 0.65 mg/dL (ref 0.57–1.00)
Globulin, Total: 2.6 g/dL (ref 1.5–4.5)
Glucose: 85 mg/dL (ref 70–99)
Potassium: 4.1 mmol/L (ref 3.5–5.2)
Sodium: 141 mmol/L (ref 134–144)
Total Protein: 7 g/dL (ref 6.0–8.5)
eGFR: 118 mL/min/1.73 (ref >59)

## 2024-08-13 LAB — HEMOGLOBIN A1C
Est. average glucose Bld gHb Est-mCnc: 108 mg/dL
Hgb A1c MFr Bld: 5.4 % (ref 4.8–5.6)

## 2024-08-13 LAB — TSH: TSH: 1.55 u[IU]/mL (ref 0.450–4.500)

## 2024-08-15 ENCOUNTER — Ambulatory Visit: Payer: Self-pay | Admitting: Physician Assistant

## 2024-09-09 ENCOUNTER — Ambulatory Visit: Admitting: Physician Assistant

## 2024-09-09 ENCOUNTER — Encounter: Payer: Self-pay | Admitting: Physician Assistant

## 2024-09-09 DIAGNOSIS — F411 Generalized anxiety disorder: Secondary | ICD-10-CM

## 2024-09-09 MED ORDER — ESCITALOPRAM OXALATE 10 MG PO TABS: 10.0000 mg | ORAL_TABLET | Freq: Every day | ORAL | Status: AC

## 2024-09-09 MED ORDER — ZEPBOUND 2.5 MG/0.5ML ~~LOC~~ SOAJ: 2.5000 mg | SUBCUTANEOUS | 0 refills | Status: DC

## 2024-09-09 NOTE — Progress Notes (Signed)
 "   Date:  09/09/2024   Name:  Sheila Gilbert   DOB:  Oct 22, 1990   MRN:  [id number]   Chief Complaint: Weight Check (Down 2 lbs)  HPI  Sheila Gilbert returns for 1 month follow-up on weight management presently on Wegovy  which was increased to 1 mg two months ago. Up about 2 lb since last time. She has lost a stunning 55 pounds since starting the medication Nov 2024.  Last visit she reported suspicion that the combination of Wegovy  and escitalopram  was causing worsening GI side effects, so I had her decrease her escitalopram  dose to 5 mg daily.  Unfortunately, this has not really resulted in significant change to GI side effects and she also reports worsening anxiety and sleep.    She inquires about the duration of her GLP therapy.  She would like to get to a goal weight of 200 pounds.  She does not want to be on such medication forever.  She wonders if she might have some undiagnosed ADHD and/or ASD as she has a strong family history for both of these conditions.  At this time, she would not like referral to psychology/psychiatry. Intends to start counseling/therapy soon.   Medication list has been reviewed and updated.  Active Medications[1]   Review of Systems  Patient Active Problem List   Diagnosis Date Noted   Generalized anxiety disorder 12/04/2023   Moderate episode of recurrent major depressive disorder (HCC) 12/04/2023   Mild hyperlipidemia 08/03/2023   Metabolic syndrome 08/03/2023   Class 1 obesity with serious comorbidity in adult 07/31/2023   Prediabetes 07/31/2023   Family history of breast cancer 10/30/2022   Family history of ovarian cancer 10/30/2022   Hidradenitis suppurativa 04/09/2016    Allergies[2]  Immunization History  Administered Date(s) Administered   Influenza, Seasonal, Injecte, Preservative Fre 07/31/2023   PFIZER(Purple Top)SARS-COV-2 Vaccination 02/09/2020, 03/02/2020   Rabies, IM 04/24/2024, 05/01/2024, 05/09/2024   Tdap 04/24/2024    Past Surgical  History:  Procedure Laterality Date   NO PAST SURGERIES      Social History[3]  Family History  Problem Relation Age of Onset   Hypertension Mother    Diabetes Mother    Hypertension Father    Heart disease Father    Diabetes Father    Breast cancer Maternal Aunt 30   Breast cancer Maternal Grandmother 50   Ovarian cancer Maternal Grandmother        50-60s        09/09/2024    1:08 PM 07/13/2024    8:45 AM 06/10/2024    8:31 AM 05/13/2024    9:02 AM  GAD 7 : Generalized Anxiety Score  Nervous, Anxious, on Edge 2 2 2 3   Control/stop worrying 2 1 2 3   Worry too much - different things 2 1 2 3   Trouble relaxing 2 1 2 3   Restless 2 2 1 3   Easily annoyed or irritable 2 1 1 2   Afraid - awful might happen 1 1 2 3   Total GAD 7 Score 13 9 12 20   Anxiety Difficulty Somewhat difficult Somewhat difficult Somewhat difficult Somewhat difficult       09/09/2024    1:08 PM 07/13/2024    8:44 AM 06/10/2024    8:31 AM  Depression screen PHQ 2/9  Decreased Interest 0 1 1  Down, Depressed, Hopeless 0 0 1  PHQ - 2 Score 0 1 2  Altered sleeping 3 2 3   Tired, decreased energy 3 2 3   Change in  appetite 3 2 1   Feeling bad or failure about yourself  1 1 2   Trouble concentrating 1 1 2   Moving slowly or fidgety/restless 1 1 1   Suicidal thoughts 0 0 0  PHQ-9 Score 12 10  14    Difficult doing work/chores Somewhat difficult Somewhat difficult Not difficult at all     Data saved with a previous flowsheet row definition    BP Readings from Last 3 Encounters:  09/09/24 104/84  08/12/24 126/86  07/13/24 116/80    Wt Readings from Last 3 Encounters:  09/09/24 227 lb (103 kg)  08/12/24 225 lb (102.1 kg)  07/13/24 229 lb 6.4 oz (104.1 kg)    BP 104/84   Pulse 70   Temp 98.4 F (36.9 C)   Ht 5' 10 (1.778 m)   Wt 227 lb (103 kg)   SpO2 98%   BMI 32.57 kg/m   Physical Exam Vitals and nursing note reviewed.  Constitutional:      Appearance: Normal appearance.  Cardiovascular:      Rate and Rhythm: Normal rate.  Pulmonary:     Effort: Pulmonary effort is normal.  Abdominal:     General: There is no distension.  Musculoskeletal:        General: Normal range of motion.  Skin:    General: Skin is warm and dry.  Neurological:     Mental Status: She is alert and oriented to person, place, and time.     Gait: Gait is intact.  Psychiatric:        Mood and Affect: Mood and affect normal.     Recent Labs     Component Value Date/Time   NA 141 08/12/2024 1331   K 4.1 08/12/2024 1331   CL 104 08/12/2024 1331   CO2 22 08/12/2024 1331   GLUCOSE 85 08/12/2024 1331   GLUCOSE 87 12/08/2023 1728   BUN 12 08/12/2024 1331   CREATININE 0.65 08/12/2024 1331   CALCIUM 9.3 08/12/2024 1331   PROT 7.0 08/12/2024 1331   ALBUMIN 4.4 08/12/2024 1331   AST 19 08/12/2024 1331   ALT 24 08/12/2024 1331   ALKPHOS 58 08/12/2024 1331   BILITOT 0.3 08/12/2024 1331   GFRNONAA >60 12/08/2023 1728   GFRAA >60 10/06/2017 1944    Lab Results  Component Value Date   WBC 9.2 12/08/2023   HGB 13.6 12/08/2023   HCT 41.4 12/08/2023   MCV 88.5 12/08/2023   PLT 328 12/08/2023   Lab Results  Component Value Date   HGBA1C 5.4 08/12/2024   HGBA1C 5.9 (H) 11/06/2023   HGBA1C 6.4 (H) 07/31/2023   Lab Results  Component Value Date   CHOL 196 08/12/2024   HDL 45 08/12/2024   LDLCALC 134 (H) 08/12/2024   TRIG 91 08/12/2024   CHOLHDL 4.4 08/12/2024   Lab Results  Component Value Date   TSH 1.550 08/12/2024      Assessment and Plan:  1. 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   Advised the importance of adequate protein intake on this medication as well as resistance training which will not only improve insulin sensitivity but also build muscle mass and reduce risk of atrophy.  I recommended minimum 20 min sessions  3x/wk. Advised importance of portion control and slower eating on this medication to minimize GI side effects. Avoid high fat foods as  these may cause discomfort. Patient aware of potential for weight regain when eventually stopping the medication. For this reason, continued efforts toward improving lifestyle will be particularly important moving forward.  - ZEPBOUND 2.5 MG/0.5ML Pen; Inject 2.5 mg into the skin once a week.  Dispense: 2 mL; Refill: 0  2. Generalized anxiety disorder Increase escitalopram  back to 10 mg daily.  Encouraged counseling/therapy. - escitalopram  (LEXAPRO ) 10 MG tablet; Take 1 tablet (10 mg total) by mouth daily.   Follow-up 1 month OV weight management  Rolan Hoyle, PA-C, DMSc, DipACLM, Nutritionist [facility] [phone]      [1]  Current Meds  Medication Sig   albuterol  (VENTOLIN  HFA) 108 (90 Base) MCG/ACT inhaler Inhale into the lungs.   clindamycin  (CLEOCIN -T) 1 % lotion Apply topically 2 (two) times daily. Apply topically BID prn   dicyclomine  (BENTYL ) 10 MG capsule Take 1 capsule (10 mg total) by mouth 3 (three) times daily as needed for spasms.   escitalopram  (LEXAPRO ) 10 MG tablet Take 0.5 tablets (5 mg total) by mouth daily.   etonogestrel -ethinyl estradiol  (NUVARING) 0.12-0.015 MG/24HR vaginal ring Insert vaginally and leave in place for 3 consecutive weeks, then remove for 1 week.   metoCLOPramide  (REGLAN ) 10 MG tablet Take 1 tablet (10 mg total) by mouth every 6 (six) hours as needed for nausea or vomiting.   WEGOVY  1 MG/0.5ML SOAJ SQ injection Inject 1 mg into the skin once a week.  [2]  Allergies Allergen Reactions   Bee Pollen Other (See Comments)   Dust Mite Extract Other (See Comments)   Latex Swelling  [3]  Social History Tobacco Use   Smoking status: Never   Smokeless tobacco: Never  Vaping Use   Vaping status: Never Used  Substance Use Topics    Alcohol use: Not Currently   Drug use: No   "

## 2024-09-12 ENCOUNTER — Other Ambulatory Visit (HOSPITAL_COMMUNITY): Payer: Self-pay

## 2024-09-12 NOTE — Telephone Encounter (Signed)
 PA request has been Started. New Encounter has been or will be created for follow up. For additional info see Pharmacy Prior Auth telephone encounter from 09/12/24.

## 2024-09-12 NOTE — Telephone Encounter (Signed)
 Please submit PA for Zepbound for patient. Thank you.  JM

## 2024-09-13 ENCOUNTER — Telehealth: Payer: Self-pay

## 2024-09-13 ENCOUNTER — Telehealth: Payer: Self-pay | Admitting: Pharmacy Technician

## 2024-09-13 ENCOUNTER — Other Ambulatory Visit (HOSPITAL_COMMUNITY): Payer: Self-pay

## 2024-09-13 NOTE — Telephone Encounter (Signed)
 Please complete PA for Zepbound 2.5 MG.  KP

## 2024-09-13 NOTE — Telephone Encounter (Signed)
 PA request has been Submitted. New Encounter has been or will be created for follow up. For additional info see Pharmacy Prior Auth telephone encounter from 09/13/24.

## 2024-09-13 NOTE — Telephone Encounter (Signed)
 Pharmacy Patient Advocate Encounter   Received notification from Pt Calls Messages that prior authorization for Zepbound  2.5MG /0.5ML pen-injectors is required/requested.   Insurance verification completed.   The patient is insured through [employer] [facility].   Per test claim: PA required; PA submitted to above mentioned insurance via Latent Key/confirmation #/EOC [id number] Status is pending

## 2024-09-14 ENCOUNTER — Other Ambulatory Visit (HOSPITAL_COMMUNITY): Payer: Self-pay

## 2024-09-14 NOTE — Telephone Encounter (Signed)
 Pharmacy Patient Advocate Encounter  Received notification from [facility] that Prior Authorization for Zepbound  2.5MG /0.5ML pen-injectors has been APPROVED from 09/13/24 to 03/12/25. Unable to obtain price due to refill too soon rejection, last fill date 09/13/24 next available fill date01/27/26   PA #/Case ID/Reference #: [id number]

## 2024-09-15 NOTE — Telephone Encounter (Signed)
 Received notification from [facility] that Prior Authorization for Zepbound  2.5MG /0.5ML pen-injectors has been APPROVED from 09/13/24 to 03/12/25.

## 2024-10-07 ENCOUNTER — Ambulatory Visit: Admitting: Physician Assistant

## 2024-10-07 ENCOUNTER — Encounter: Payer: Self-pay | Admitting: Physician Assistant

## 2024-10-07 MED ORDER — ZEPBOUND 2.5 MG/0.5ML ~~LOC~~ SOAJ: 2.5000 mg | SUBCUTANEOUS | 0 refills | Status: AC

## 2024-10-07 NOTE — Progress Notes (Signed)
 "   Date:  10/07/2024   Name:  Sheila Gilbert   DOB:  03/07/1991   MRN:  [id number]   Chief Complaint: Weight Check (Down 2 lbs/)  HPI  Abbee returns for 1 month follow-up on weight management after switching from Wegovy  to Zepbound  2.5 mg last visit. She thinks this was a positive change overall, with fewer side effects reported. She has also felt more motivated to go to the gym, and has been going right after work 3 times per week.  Down about 2 lb since last time. She has lost a stunning 60 pounds since starting the medication Nov 2024.    Medication list has been reviewed and updated.  Active Medications[1]   Review of Systems  Patient Active Problem List   Diagnosis Date Noted   Generalized anxiety disorder 12/04/2023   Moderate episode of recurrent major depressive disorder (HCC) 12/04/2023   Mild hyperlipidemia 08/03/2023   Metabolic syndrome 08/03/2023   Class 1 obesity with serious comorbidity in adult 07/31/2023   Prediabetes 07/31/2023   Family history of breast cancer 10/30/2022   Family history of ovarian cancer 10/30/2022   Hidradenitis suppurativa 04/09/2016    Allergies[2]  Immunization History  Administered Date(s) Administered   Influenza, Seasonal, Injecte, Preservative Fre 07/31/2023   PFIZER(Purple Top)SARS-COV-2 Vaccination 02/09/2020, 03/02/2020   Rabies, IM 04/24/2024, 05/01/2024, 05/09/2024   Tdap 04/24/2024    Past Surgical History:  Procedure Laterality Date   NO PAST SURGERIES      Social History[3]  Family History  Problem Relation Age of Onset   Hypertension Mother    Diabetes Mother    Hypertension Father    Heart disease Father    Diabetes Father    Breast cancer Maternal Aunt 30   Breast cancer Maternal Grandmother 50   Ovarian cancer Maternal Grandmother        50-60s        10/07/2024   10:18 AM 09/09/2024    1:08 PM 07/13/2024    8:45 AM 06/10/2024    8:31 AM  GAD 7 : Generalized Anxiety Score  Nervous, Anxious, on Edge  2 2  2  2    Control/stop worrying 2 2  1  2    Worry too much - different things 2 2  1  2    Trouble relaxing 2 2  1  2    Restless 2 2  2  1    Easily annoyed or irritable 2 2  1  1    Afraid - awful might happen 1 1  1  2    Total GAD 7 Score 13 13 9 12   Anxiety Difficulty Somewhat difficult Somewhat difficult Somewhat difficult Somewhat difficult     Data saved with a previous flowsheet row definition       10/07/2024   10:18 AM 09/09/2024    1:08 PM 07/13/2024    8:44 AM  Depression screen PHQ 2/9  Decreased Interest 0 0 1  Down, Depressed, Hopeless 0 0 0  PHQ - 2 Score 0 0 1  Altered sleeping 3 3 2   Tired, decreased energy 3 3 2   Change in appetite 3 3 2   Feeling bad or failure about yourself  1 1 1   Trouble concentrating 1 1 1   Moving slowly or fidgety/restless 1 1 1   Suicidal thoughts 0 0 0  PHQ-9 Score 12 12 10    Difficult doing work/chores Somewhat difficult Somewhat difficult Somewhat difficult     Data saved with a previous flowsheet row  definition    BP Readings from Last 3 Encounters:  10/07/24 106/82  09/09/24 104/84  08/12/24 126/86    Wt Readings from Last 3 Encounters:  10/07/24 225 lb (102.1 kg)  09/09/24 227 lb (103 kg)  08/12/24 225 lb (102.1 kg)    BP 106/82   Pulse 67   Temp 98.7 F (37.1 C)   Ht 5' 10 (1.778 m)   Wt 225 lb (102.1 kg)   SpO2 97%   BMI 32.28 kg/m   Physical Exam Vitals and nursing note reviewed.  Constitutional:      Appearance: Normal appearance. She is obese.  Cardiovascular:     Rate and Rhythm: Normal rate.  Pulmonary:     Effort: Pulmonary effort is normal.  Abdominal:     General: There is no distension.  Musculoskeletal:        General: Normal range of motion.  Skin:    General: Skin is warm and dry.  Neurological:     Mental Status: She is alert and oriented to person, place, and time.     Gait: Gait is intact.  Psychiatric:        Mood and Affect: Mood and affect normal.     Recent Labs      Component Value Date/Time   NA 141 08/12/2024 1331   K 4.1 08/12/2024 1331   CL 104 08/12/2024 1331   CO2 22 08/12/2024 1331   GLUCOSE 85 08/12/2024 1331   GLUCOSE 87 12/08/2023 1728   BUN 12 08/12/2024 1331   CREATININE 0.65 08/12/2024 1331   CALCIUM 9.3 08/12/2024 1331   PROT 7.0 08/12/2024 1331   ALBUMIN 4.4 08/12/2024 1331   AST 19 08/12/2024 1331   ALT 24 08/12/2024 1331   ALKPHOS 58 08/12/2024 1331   BILITOT 0.3 08/12/2024 1331   GFRNONAA >60 12/08/2023 1728   GFRAA >60 10/06/2017 1944    Lab Results  Component Value Date   WBC 9.2 12/08/2023   HGB 13.6 12/08/2023   HCT 41.4 12/08/2023   MCV 88.5 12/08/2023   PLT 328 12/08/2023   Lab Results  Component Value Date   HGBA1C 5.4 08/12/2024   HGBA1C 5.9 (H) 11/06/2023   HGBA1C 6.4 (H) 07/31/2023   Lab Results  Component Value Date   CHOL 196 08/12/2024   HDL 45 08/12/2024   LDLCALC 134 (H) 08/12/2024   TRIG 91 08/12/2024   CHOLHDL 4.4 08/12/2024   Lab Results  Component Value Date   TSH 1.550 08/12/2024        Assessment & Plan Morbid obesity controlled with GLP therapy (HCC) Doing well with Zepbound . Through shared decision-making we will keep it 2.5 mg for this next month then reassess.   Advised the importance of adequate protein intake on this medication as well as resistance training which will not only improve insulin sensitivity but also build muscle mass and reduce risk of atrophy.  I recommended minimum 20 min sessions 3x/wk for strength training, with overall exercise goal of 150 min moderate intensity per week. Advised importance of portion control and slower eating on this medication to minimize GI side effects. Avoid high fat foods as these may cause discomfort. Patient aware of potential for weight regain when eventually stopping the medication. For this reason, continued efforts toward improving lifestyle will be particularly important moving forward. Patient will have routine and ongoing  support for nutrition guidance, lifestyle modification, and medication adjustments.   Orders:   ZEPBOUND  2.5 MG/0.5ML Pen; Inject 2.5 mg  into the skin once a week.    F/u 5wk OV Zep   Rolan Hoyle, PA-C, DMSc, DipACLM, Nutritionist [facility] [phone]      [1]  Current Meds  Medication Sig   albuterol  (VENTOLIN  HFA) 108 (90 Base) MCG/ACT inhaler Inhale into the lungs.   clindamycin  (CLEOCIN -T) 1 % lotion Apply topically 2 (two) times daily. Apply topically BID prn   dicyclomine  (BENTYL ) 10 MG capsule Take 1 capsule (10 mg total) by mouth 3 (three) times daily as needed for spasms.   escitalopram  (LEXAPRO ) 10 MG tablet Take 1 tablet (10 mg total) by mouth daily.   etonogestrel -ethinyl estradiol  (NUVARING) 0.12-0.015 MG/24HR vaginal ring Insert vaginally and leave in place for 3 consecutive weeks, then remove for 1 week.   metoCLOPramide  (REGLAN ) 10 MG tablet Take 1 tablet (10 mg total) by mouth every 6 (six) hours as needed for nausea or vomiting.   [DISCONTINUED] ZEPBOUND  2.5 MG/0.5ML Pen Inject 2.5 mg into the skin once a week.  [2]  Allergies Allergen Reactions   Bee Pollen Other (See Comments)   Dust Mite Extract Other (See Comments)   Latex Swelling  [3]  Social History Tobacco Use   Smoking status: Never   Smokeless tobacco: Never  Vaping Use   Vaping status: Never Used  Substance Use Topics   Alcohol use: Not Currently   Drug use: No   "

## 2024-10-08 ENCOUNTER — Other Ambulatory Visit: Payer: Self-pay | Admitting: Physician Assistant

## 2024-10-10 NOTE — Telephone Encounter (Signed)
 Requested Prescriptions  Refused Prescriptions Disp Refills   ZEPBOUND  2.5 MG/0.5ML Pen [Pharmacy Med Name: Zepbound  2.5 MG/0.5ML Subcutaneous Solution Auto-injector] 4 mL 0    Sig: INJECT 1/2 (ONE-HALF) ML SUBCUTANEOUSLY  ONCE A WEEK     Off-Protocol Failed - 10/10/2024  4:45 PM      Failed - Medication not assigned to a protocol, review manually.      Passed - Valid encounter within last 12 months    Recent Outpatient Visits           3 days ago Morbid obesity controlled with GLP therapy [facility])   [place] [facility] at [facility], [place]   1 month ago Morbid obesity controlled with GLP therapy [facility])   [place] [facility] at [facility], [place]   1 month ago Class 1 obesity with serious comorbidity and body mass index (BMI) of 33.0 to 33.9 in adult, unspecified obesity type   [facility] at [facility], [place]   2 months ago Class 1 obesity with serious comorbidity and body mass index (BMI) of 33.0 to 33.9 in adult, unspecified obesity type   [facility] at [facility], [place]   4 months ago Moderate episode of recurrent major depressive disorder [facility])   [facility] at [facility], [place], [place]       Future Appointments             In 4 months Claudene Lehmann, MD [facility]

## 2024-11-11 ENCOUNTER — Ambulatory Visit: Admitting: Physician Assistant

## 2025-02-07 ENCOUNTER — Ambulatory Visit: Admitting: Dermatology
